# Patient Record
Sex: Female | Born: 1985 | Race: White | Hispanic: No | Marital: Single | Smoking: Current some day smoker
Health system: Southern US, Community
[De-identification: ages and names within clinical notes are randomized; demographics above are authoritative.]

## PROBLEM LIST (undated history)

## (undated) DIAGNOSIS — D259 Leiomyoma of uterus, unspecified: Secondary | ICD-10-CM

## (undated) DIAGNOSIS — R0789 Other chest pain: Secondary | ICD-10-CM

## (undated) DIAGNOSIS — N63 Unspecified lump in unspecified breast: Secondary | ICD-10-CM

## (undated) DIAGNOSIS — F419 Anxiety disorder, unspecified: Secondary | ICD-10-CM

## (undated) DIAGNOSIS — A749 Chlamydial infection, unspecified: Secondary | ICD-10-CM

## (undated) DIAGNOSIS — E119 Type 2 diabetes mellitus without complications: Secondary | ICD-10-CM

## (undated) DIAGNOSIS — F410 Panic disorder [episodic paroxysmal anxiety] without agoraphobia: Secondary | ICD-10-CM

## (undated) DIAGNOSIS — E7800 Pure hypercholesterolemia, unspecified: Secondary | ICD-10-CM

## (undated) HISTORY — DX: Unspecified lump in unspecified breast: N63.0

## (undated) HISTORY — DX: Panic disorder (episodic paroxysmal anxiety): F41.0

## (undated) HISTORY — PX: OTHER SURGICAL HISTORY: SHX169

## (undated) HISTORY — DX: Other chest pain: R07.89

## (undated) HISTORY — DX: Anxiety disorder, unspecified: F41.9

## (undated) HISTORY — DX: Chlamydial infection, unspecified: A74.9

---

## 2005-05-11 DIAGNOSIS — A749 Chlamydial infection, unspecified: Secondary | ICD-10-CM

## 2005-05-11 HISTORY — DX: Chlamydial infection, unspecified: A74.9

## 2010-03-09 ENCOUNTER — Emergency Department (HOSPITAL_COMMUNITY): Admission: EM | Admit: 2010-03-09 | Discharge: 2010-03-09 | Payer: Self-pay | Admitting: Emergency Medicine

## 2010-03-11 ENCOUNTER — Emergency Department (HOSPITAL_COMMUNITY): Admission: EM | Admit: 2010-03-11 | Discharge: 2010-03-11 | Payer: Self-pay | Admitting: Emergency Medicine

## 2010-03-12 ENCOUNTER — Emergency Department (HOSPITAL_COMMUNITY): Admission: EM | Admit: 2010-03-12 | Discharge: 2010-03-12 | Payer: Self-pay | Admitting: Family Medicine

## 2010-06-02 ENCOUNTER — Emergency Department (HOSPITAL_COMMUNITY)
Admission: EM | Admit: 2010-06-02 | Discharge: 2010-06-02 | Payer: Self-pay | Source: Home / Self Care | Admitting: Family Medicine

## 2010-06-03 LAB — POCT URINALYSIS DIPSTICK
Bilirubin Urine: NEGATIVE
Ketones, ur: NEGATIVE mg/dL
Nitrite: NEGATIVE
Protein, ur: NEGATIVE mg/dL
Specific Gravity, Urine: 1.02 (ref 1.005–1.030)
Urine Glucose, Fasting: NEGATIVE mg/dL
Urobilinogen, UA: 0.2 mg/dL (ref 0.0–1.0)
pH: 7 (ref 5.0–8.0)

## 2010-06-03 LAB — POCT PREGNANCY, URINE: Preg Test, Ur: NEGATIVE

## 2012-06-15 ENCOUNTER — Encounter (HOSPITAL_COMMUNITY): Payer: Self-pay | Admitting: *Deleted

## 2012-06-21 ENCOUNTER — Other Ambulatory Visit: Payer: Self-pay | Admitting: Obstetrics and Gynecology

## 2012-06-21 ENCOUNTER — Encounter (HOSPITAL_COMMUNITY): Payer: Self-pay

## 2012-06-21 ENCOUNTER — Ambulatory Visit (HOSPITAL_COMMUNITY)
Admission: RE | Admit: 2012-06-21 | Discharge: 2012-06-21 | Disposition: A | Discharge status: Home / Self Care | Payer: Self-pay | Source: Ambulatory Visit | Attending: Obstetrics and Gynecology | Admitting: Obstetrics and Gynecology

## 2012-06-21 VITALS — BP 110/60 | Temp 98.1°F | Ht 62.0 in | Wt 153.2 lb

## 2012-06-21 DIAGNOSIS — N644 Mastodynia: Secondary | ICD-10-CM

## 2012-06-21 DIAGNOSIS — N6320 Unspecified lump in the left breast, unspecified quadrant: Secondary | ICD-10-CM | POA: Insufficient documentation

## 2012-06-21 DIAGNOSIS — N63 Unspecified lump in unspecified breast: Secondary | ICD-10-CM

## 2012-06-21 DIAGNOSIS — Z1239 Encounter for other screening for malignant neoplasm of breast: Secondary | ICD-10-CM

## 2012-06-21 HISTORY — DX: Mastodynia: N64.4

## 2012-06-21 HISTORY — DX: Unspecified lump in the left breast, unspecified quadrant: N63.20

## 2012-06-21 NOTE — Progress Notes (Signed)
Patient complained of lumps and discomfort in both breasts. Patient rated pain at a 2 out of 10. Patient complained that nipple is pointing more to the left.  Pap Smear:    Pap smear not completed today. Last Pap smear was 02/11/2012 at the [facility] and normal. Per patient no history of abnormal Pap smears. No Pap smear results in EPIC.  Physical exam: Breasts Breasts symmetrical. No skin abnormalities bilateral breasts. No nipple retraction bilateral breasts. No nipple discharge bilateral breasts. No lymphadenopathy. No lumps palpated right breast. Palpated two lumps in the left breast at 3 o'clock 4 cm from the areola and 12 o'clock 1.5 cm from the areola. Complaints of tenderness when palpated right center breast. Patient referred to the [facility] for bilateral breast ultrasound. Appointment scheduled for Monday, June 27, 2012 at 1245.     Pelvic/Bimanual No Pap smear completed today since last Pap smear was 02/11/2012. Pap smear not indicated per BCCCP guidelines.

## 2012-06-21 NOTE — Patient Instructions (Signed)
Taught patient how to perform BSE. Patient did not need a Pap smear today due to last Pap smear was in 02/11/2012. Let her know BCCCP will cover Pap smears every 3 years unless has a history of abnormal Pap smears. Patient referred to the [facility] for bilateral breast ultrasound. Appointment scheduled for Monday, June 27, 2012 at 1245. Patient aware of appointment and will be there. Patient verbalized understanding.

## 2012-06-27 ENCOUNTER — Ambulatory Visit
Admission: RE | Admit: 2012-06-27 | Discharge: 2012-06-27 | Disposition: A | Discharge status: Home / Self Care | Payer: No Typology Code available for payment source | Source: Ambulatory Visit | Attending: Obstetrics and Gynecology | Admitting: Obstetrics and Gynecology

## 2012-06-27 DIAGNOSIS — N63 Unspecified lump in unspecified breast: Secondary | ICD-10-CM

## 2012-06-27 DIAGNOSIS — N644 Mastodynia: Secondary | ICD-10-CM

## 2012-09-14 ENCOUNTER — Ambulatory Visit (INDEPENDENT_AMBULATORY_CARE_PROVIDER_SITE_OTHER): Payer: BC Managed Care – PPO | Admitting: Physician Assistant

## 2012-09-14 VITALS — BP 102/66 | HR 96 | Temp 99.1°F | Resp 16 | Ht 62.5 in | Wt 156.4 lb

## 2012-09-14 DIAGNOSIS — N898 Other specified noninflammatory disorders of vagina: Secondary | ICD-10-CM

## 2012-09-14 DIAGNOSIS — B9689 Other specified bacterial agents as the cause of diseases classified elsewhere: Secondary | ICD-10-CM

## 2012-09-14 DIAGNOSIS — Z113 Encounter for screening for infections with a predominantly sexual mode of transmission: Secondary | ICD-10-CM

## 2012-09-14 DIAGNOSIS — N760 Acute vaginitis: Secondary | ICD-10-CM

## 2012-09-14 DIAGNOSIS — Z309 Encounter for contraceptive management, unspecified: Secondary | ICD-10-CM

## 2012-09-14 DIAGNOSIS — IMO0001 Reserved for inherently not codable concepts without codable children: Secondary | ICD-10-CM

## 2012-09-14 LAB — POCT WET PREP WITH KOH
Clue Cells Wet Prep HPF POC: 100
KOH Prep POC: NEGATIVE
RBC Wet Prep HPF POC: NEGATIVE
Trichomonas, UA: NEGATIVE
WBC Wet Prep HPF POC: NEGATIVE
Yeast Wet Prep HPF POC: NEGATIVE

## 2012-09-14 LAB — POCT URINE PREGNANCY: Preg Test, Ur: NEGATIVE

## 2012-09-14 MED ORDER — MEDROXYPROGESTERONE ACETATE 150 MG/ML IM SUSP
150.0000 mg | INTRAMUSCULAR | Status: AC
  Administered 2012-09-14: 150 mg via INTRAMUSCULAR

## 2012-09-14 MED ORDER — METRONIDAZOLE 500 MG PO TABS: 500.0000 mg | ORAL_TABLET | Freq: Two times a day (BID) | ORAL | Status: DC

## 2012-09-14 NOTE — Progress Notes (Signed)
Subjective:    Patient ID: Jill Burke, female    DOB: 1985/12/11, 27 y.o.   MRN: [id number]  HPI  This 27 y.o. female presents for evaluation of vaginal discharge x 4-6 weeks, with a foul odor x 2-3 days. She is also 2 days overdue for DepoProvera for contraception.  She was scheduled at the [facility], but they weren't able to see her for both today.  She is one sexual partner, but recently received a Facebook message from a woman who claims to have been having unprotected sex x 7 months with the patient's longtime partner.   Past Medical History  Diagnosis Date  . Chlamydia 2007    treated    History reviewed. No pertinent past surgical history.  Prior to Admission medications   Medication Sig Start Date End Date Taking? Authorizing Provider  medroxyPROGESTERone (DEPO-PROVERA) 150 MG/ML injection Inject 150 mg into the muscle every 3 (three) months.   Yes Historical Provider, MD  metroNIDAZOLE (FLAGYL) 500 MG tablet Take 1 tablet (500 mg total) by mouth 2 (two) times daily with a meal. DO NOT CONSUME ALCOHOL WHILE TAKING THIS MEDICATION. 09/14/12   Fernande Bras, PA-C    No Known Allergies  History   Social History  . Marital Status: Single    Spouse Name: N/A    Number of Children: 3  . Years of Education: 12   Occupational History  . collections agent    Social History Main Topics  . Smoking status: Current Every Day Smoker -- 0.50 packs/day for 3 years  . Smokeless tobacco: Never Used  . Alcohol Use: No  . Drug Use: No  . Sexually Active: Yes    Birth Control/ Protection: Injection    Family History  Problem Relation Age of Onset  . Hypertension Father   . Heart disease Father   . Diabetes Paternal Grandmother      Review of Systems As above.  Denies chest pain, shortness of breath, HA, dizziness, vision change, nausea, vomiting, diarrhea, constipation, melena, hematochezia, dysuria, increased urinary urgency or frequency, increased  hunger or thirst, unintentional weight change, unexplained myalgias or arthralgias, rash.     Objective:   Physical Exam  Vitals reviewed. Constitutional: She is oriented to person, place, and time. She appears well-developed and well-nourished.  Eyes: Conjunctivae are normal. No scleral icterus.  Cardiovascular: Normal rate.   Pulmonary/Chest: Effort normal.  Abdominal: Hernia confirmed negative in the right inguinal area and confirmed negative in the left inguinal area.  Genitourinary: Uterus normal. Pelvic exam was performed with patient supine. No labial fusion. There is no rash, tenderness, lesion or injury on the right labia. There is no rash, tenderness, lesion or injury on the left labia. Cervix exhibits no motion tenderness, no discharge and no friability. Right adnexum displays no mass, no tenderness and no fullness. Left adnexum displays no mass, no tenderness and no fullness. No erythema, tenderness or bleeding around the vagina. No foreign body around the vagina. No signs of injury around the vagina. Vaginal discharge found.  Lymphadenopathy:       Right: No inguinal adenopathy present.       Left: No inguinal adenopathy present.  Neurological: She is alert and oriented to person, place, and time.  Skin: Skin is warm and dry.  Psychiatric: She has a normal mood and affect. Her behavior is normal.      Results for orders placed in visit on 09/14/12  POCT URINE PREGNANCY  Result Value Range   Preg Test, Ur Negative    POCT WET PREP WITH KOH      Result Value Range   Trichomonas, UA Negative     Clue Cells Wet Prep HPF POC 100%     Epithelial Wet Prep HPF POC 10-20     Yeast Wet Prep HPF POC neg     Bacteria Wet Prep HPF POC 4++     RBC Wet Prep HPF POC neg     WBC Wet Prep HPF POC neg     KOH Prep POC Negative         Assessment & Plan:  Contraception - Plan: POCT urine pregnancy, medroxyPROGESTERone (DEPO-PROVERA) injection 150 mg  Routine screening for STI  (sexually transmitted infection) - Plan: Hepatitis B surface antibody, Hepatitis B surface antigen, Hepatitis C antibody, HIV antibody, HSV(herpes simplex vrs) 1+2 ab-IgG, RPR, GC/Chlamydia Probe Amp  Vaginal discharge - Plan: GC/Chlamydia Probe Amp, POCT Wet Prep with KOH  BV (bacterial vaginosis) - Plan: metroNIDAZOLE (FLAGYL) 500 MG tablet   Patient Instructions  Return for the next DepoProvera injection 7/23-8/06. Schedule a complete physical at your convenience (this can be done at the same time as your Depo Provera injection).  I will contact you with your lab results as soon as they are available.   If you have not heard from me in 2 weeks, please contact me.  The fastest way to get your results is to register for My Chart (see the instructions on the last page of this printout).     Fernande Bras, PA-C Physician Assistant-Certified [facility]

## 2012-09-14 NOTE — Patient Instructions (Addendum)
Return for the next DepoProvera injection 7/23-8/06. Schedule a complete physical at your convenience (this can be done at the same time as your Depo Provera injection).  I will contact you with your lab results as soon as they are available.   If you have not heard from me in 2 weeks, please contact me.  The fastest way to get your results is to register for My Chart (see the instructions on the last page of this printout).

## 2012-09-15 LAB — HEPATITIS B SURFACE ANTIGEN: Hepatitis B Surface Ag: NEGATIVE

## 2012-09-15 LAB — HIV ANTIBODY (ROUTINE TESTING W REFLEX): HIV: NONREACTIVE

## 2012-09-15 LAB — HEPATITIS C ANTIBODY: HCV Ab: NEGATIVE

## 2012-09-15 LAB — RPR

## 2012-09-16 LAB — HEPATITIS B SURFACE ANTIBODY, QUANTITATIVE: Hepatitis B-Post: >1000 m[IU]/mL

## 2012-09-16 LAB — GC/CHLAMYDIA PROBE AMP
CT Probe RNA: NEGATIVE
GC Probe RNA: NEGATIVE

## 2012-09-16 LAB — HSV(HERPES SIMPLEX VRS) I + II AB-IGG
HSV 1 Glycoprotein G Ab, IgG: 10.85 IV — ABNORMAL HIGH
HSV 2 Glycoprotein G Ab, IgG: 3.29 IV — ABNORMAL HIGH

## 2012-09-19 ENCOUNTER — Encounter: Payer: Self-pay | Admitting: Family Medicine

## 2012-11-07 ENCOUNTER — Telehealth: Payer: Self-pay

## 2012-11-07 NOTE — Telephone Encounter (Signed)
Pt is needing to talk with someone about the diagnosis from may 7th visit she believes the symptoms are coming back  Best [phone]

## 2012-11-07 NOTE — Telephone Encounter (Signed)
Spoke to patient, and she is advised of labs, states she was not advised of the herpes results. She was tearful and disconnected call. She complains of recurrent vaginal d/c advised her the herpes would not cause a discharge, she should come back in for recheck, to you FYI Did advise her these results were sent via my chart.

## 2012-11-07 NOTE — Telephone Encounter (Signed)
  All these tests are normal EXCEPT: The herpes tests are positive for exposure to both the type that causes fever blisters and the type that causes genital blisters.   The test doesn't tell us when you were exposed, who exposed you, whether you ever have had an outbreak, or whether you ever will.  Your partner needs to be tested as well. If he is positive, and you've never had an outbreak that you can recall, there is nothing you need to do now. If he is negative, we'll advise you to take a medication to help prevent him from getting it.  You'll need to discuss this with any future sexual partners that you have, so they can be tested, and you can take medication to prevent transmission if they are negative.  BV (bacterial vaginosis) - Plan: metroNIDAZOLE (FLAGYL) 500 MG tablet  Called her what are her symptoms? Left message for her to call me back.

## 2012-11-08 ENCOUNTER — Telehealth: Payer: Self-pay

## 2012-11-08 NOTE — Telephone Encounter (Signed)
Patient calling back to get lab results  Best number: [phone]

## 2012-11-08 NOTE — Telephone Encounter (Signed)
Patient has been notified

## 2012-11-09 ENCOUNTER — Telehealth: Payer: Self-pay | Admitting: Radiology

## 2012-11-09 NOTE — Telephone Encounter (Signed)
Patient states she was told she will not be tested for Herpes virus/ patient states she was told she did not need testing, and also states she was not called with these results. I gave her the results by phone when I spoke to her earlier in the week. She states she got a phone call about labs previously and was not told she was positive for herpes (when I spoke to her on 11/07/12, she indicated no one called her, now she indicates she was called.) patient states she went to the health department and was told she does not have herpes because her lymph nodes are not swollen. Patient asking why she was tested when she was told she would not be tested and asking why no one gave the results. I told her previously these results were given in mychart. She states now (different from previous phone call with her) she did get a phone call, but was not given results. I told her I am not sure but I will send message to you and see if you can help.  [phone]

## 2012-11-09 NOTE — Telephone Encounter (Signed)
Patient called again about the labs, but call was disconnected, she could not hear me. If she calls back I will inform her again of her lab results.

## 2012-11-10 ENCOUNTER — Telehealth: Payer: Self-pay

## 2012-11-10 NOTE — Telephone Encounter (Signed)
Yes, she wants to speak to you, will you call her?

## 2012-11-10 NOTE — Telephone Encounter (Signed)
I am very sorry that she did not think she was getting the HSV testing and that it was done.   I reviewed my note, and did not document anything that indicated that she did not want that test.   If she did not want the test performed, and I ordered it anyway (unintentionally), we should Dr. Annette Stable the HSV testing. I released the results to her via My Chart, as I told her I would.  I am happy to explain the result to her in detail.  Perhaps she should come in so I can answer all her questions face-to-face (without an OV, unless she has other things she needs to be seen for)?  Or, I'm happy to call her-what time(s) would I be able to reach her?

## 2012-11-10 NOTE — Telephone Encounter (Signed)
I spoke with the patient by phone and explained the results.  I apologized for the miscommunication about the testing for HSV.

## 2012-11-10 NOTE — Telephone Encounter (Signed)
Patient has questions needs call back from doctor.  [phone]

## 2012-11-14 NOTE — Telephone Encounter (Signed)
Patient has spoken to [facility]

## 2012-11-16 ENCOUNTER — Telehealth: Payer: Self-pay

## 2012-11-16 NOTE — Telephone Encounter (Signed)
Jill Burke,  PATIENT WOULD LIKE TO SPEAK WITH YOU REGARDING HER LAST OV.  [phone]

## 2012-11-17 NOTE — Telephone Encounter (Addendum)
4 attempts to reach patient today.  Will try again tomorrow.

## 2012-11-17 NOTE — Telephone Encounter (Signed)
It looks like you have had lengthy conversation already. Would you like me to have her come back in?

## 2012-11-18 ENCOUNTER — Telehealth: Payer: Self-pay | Admitting: Physician Assistant

## 2012-11-18 MED ORDER — VALACYCLOVIR HCL 500 MG PO TABS: 500.0000 mg | ORAL_TABLET | Freq: Every day | ORAL | Status: DC

## 2012-11-18 NOTE — Telephone Encounter (Signed)
Discussed HSV, treatment and transmission risks at length.  Meds ordered this encounter  Medications  . valACYclovir (VALTREX) 500 MG tablet    Sig: Take 1 tablet (500 mg total) by mouth daily. Take BID x 3 days as needed for outbreak.    Dispense:  33 tablet    Refill:  12    OK to dispense #100, RF x 4 if patient prefers    Order Specific Question:  Supervising Provider    Answer:  DOOLITTLE, ROBERT P [[id number]]

## 2012-11-23 ENCOUNTER — Telehealth: Payer: Self-pay

## 2012-11-23 DIAGNOSIS — Z113 Encounter for screening for infections with a predominantly sexual mode of transmission: Secondary | ICD-10-CM

## 2012-11-23 NOTE — Telephone Encounter (Signed)
PATIENT WOULD LIKE TO GET A RETURN CALL FROM CHELLE J. SHE DID NOT WANT TO SAY WHAT IT WAS REGARDING. BEST PHONE [phone]   MBC

## 2012-11-24 NOTE — Telephone Encounter (Signed)
For Chelle on Monday.

## 2012-11-29 NOTE — Telephone Encounter (Signed)
11/29/2012 - AMY - CAN THIS ENCOUNTER BE CLOSED?  THANKS   MBC

## 2012-11-30 NOTE — Telephone Encounter (Signed)
"  Really confused." "I think I need to come back in and get re-tested." Her current partner and previous partner both tested negative for both HSV type 1 and type 2. She will come in tomorrow for lab only.  Orders placed.

## 2012-11-30 NOTE — Telephone Encounter (Signed)
No, Chelle left message for pt.

## 2013-01-17 ENCOUNTER — Ambulatory Visit (INDEPENDENT_AMBULATORY_CARE_PROVIDER_SITE_OTHER): Payer: BC Managed Care – PPO | Admitting: Family Medicine

## 2013-01-17 VITALS — BP 102/66 | HR 66 | Temp 98.5°F | Resp 18 | Ht 63.0 in | Wt 157.0 lb

## 2013-01-17 DIAGNOSIS — J029 Acute pharyngitis, unspecified: Secondary | ICD-10-CM

## 2013-01-17 LAB — POCT RAPID STREP A (OFFICE): Rapid Strep A Screen: NEGATIVE

## 2013-01-17 MED ORDER — FIRST-DUKES MOUTHWASH MT SUSP: 5.0000 mL | Freq: Four times a day (QID) | OROMUCOSAL | Status: DC | PRN

## 2013-01-17 NOTE — Progress Notes (Signed)
[facility] [address] [place] [address] [phone]  Date:  01/17/2013   Name:  Jill Burke   DOB:  28-Jan-1986   MRN:  [id number]  PCP:  Nilda Simmer, MD    Chief Complaint: Sore Throat   History of Present Illness:  Jill Burke is a 27 y.o. very pleasant female patient who presents with the following:  She has noted a ST for about 2 days- exposed at her work- place.   She has not noted a cough, but has felt "feverish."   She has not noted any body aches or chills No GI symptoms.   Patient Active Problem List   Diagnosis Date Noted  . Left breast lump 06/21/2012  . Breast pain, right 06/21/2012    Past Medical History  Diagnosis Date  . Chlamydia 2007    treated    History reviewed. No pertinent past surgical history.  History  Substance Use Topics  . Smoking status: Current Every Day Smoker -- 0.50 packs/day for 3 years  . Smokeless tobacco: Never Used  . Alcohol Use: No    Family History  Problem Relation Age of Onset  . Hypertension Father   . Heart disease Father   . Diabetes Paternal Grandmother     Allergies  Allergen Reactions  . Codeine     Medication list has been reviewed and updated.  Current Outpatient Prescriptions on File Prior to Visit  Medication Sig Dispense Refill  . medroxyPROGESTERone (DEPO-PROVERA) 150 MG/ML injection Inject 150 mg into the muscle every 3 (three) months.      . metroNIDAZOLE (FLAGYL) 500 MG tablet Take 1 tablet (500 mg total) by mouth 2 (two) times daily with a meal. DO NOT CONSUME ALCOHOL WHILE TAKING THIS MEDICATION.  14 tablet  0  . valACYclovir (VALTREX) 500 MG tablet Take 1 tablet (500 mg total) by mouth daily. Take BID x 3 days as needed for outbreak.  33 tablet  12   Current Facility-Administered Medications on File Prior to Visit  Medication Dose Route Frequency Provider Last Rate Last Dose  . medroxyPROGESTERone (DEPO-PROVERA) injection 150 mg  150 mg Intramuscular Q90 days  Fernande Bras, PA-C   150 mg at 09/14/12 2124    Review of Systems:  As per HPI- otherwise negative.   Physical Examination: Filed Vitals:   01/17/13 1227  BP: 102/66  Pulse: 66  Temp: 98.5 F (36.9 C)  Resp: 18   Filed Vitals:   01/17/13 1227  Height: 5\' 3"  (1.6 m)  Weight: 157 lb (71.215 kg)   Body mass index is 27.82 kg/(m^2). Ideal Body Weight: Weight in (lb) to have BMI = 25: 140.8  GEN: WDWN, NAD, Non-toxic, A & O x 3, looks well HEENT: Atraumatic, Normocephalic. Neck supple. No masses, No LAD.  Bilateral TM wnl, oropharynx normal.  PEERL,EOMI.   Ears and Nose: No external deformity. CV: RRR, No M/G/R. No JVD. No thrill. No extra heart sounds. PULM: CTA B, no wheezes, crackles, rhonchi. No retractions. No resp. distress. No accessory muscle use. ABD: S, NT, ND,. No rebound. No HSM. EXTR: No c/c/e NEURO Normal gait.  PSYCH: Normally interactive. Conversant. Not depressed or anxious appearing.  Calm demeanor.   Results for orders placed in visit on 01/17/13  POCT RAPID STREP A (OFFICE)      Result Value Range   Rapid Strep A Screen Negative  Negative    Assessment and Plan: Acute pharyngitis - Plan: POCT rapid strep A,  Diphenhyd-Hydrocort-Nystatin (FIRST-DUKES MOUTHWASH) SUSP  Throat culture pending.  Suspect viral pharyngitis.  DMM prn for pain.  Let me know if not better, Sooner if worse.     Signed Abbe Amsterdam, MD

## 2013-01-17 NOTE — Patient Instructions (Addendum)
I will be in touch with your culture result. Ibuprofen, tylenol and throat wash as needed

## 2013-01-19 ENCOUNTER — Encounter: Payer: Self-pay | Admitting: Family Medicine

## 2013-01-19 LAB — CULTURE, GROUP A STREP: Organism ID, Bacteria: NORMAL

## 2013-01-24 ENCOUNTER — Telehealth: Payer: Self-pay

## 2013-01-24 DIAGNOSIS — J209 Acute bronchitis, unspecified: Secondary | ICD-10-CM

## 2013-01-24 NOTE — Telephone Encounter (Signed)
Spoke with pt. She is not feeling any better and now has cough/congestion.

## 2013-01-24 NOTE — Telephone Encounter (Signed)
Patient would like to know lab results  Best # until 5:30pm [phone] after 5:30pm [phone]

## 2013-01-25 MED ORDER — AZITHROMYCIN 250 MG PO TABS: ORAL_TABLET | ORAL | Status: DC

## 2013-01-25 NOTE — Telephone Encounter (Signed)
Called her back.  She was seen about one week ago with likely viral pharyngitis, negative throat culture.    She notes she has congestion, taking mucinex.  She has head congestion.  No more ST.  She does have a cough- just started yesterday.   The mucinex is helping her to cough up some mucus.   She does not have any fever, but does feel tired.  She does not have any body aches. "i feel exhausted."  Will rx a zpack for bronchitis/ sinusitis. If not better in a few days she will come back to clinic or call.

## 2013-03-16 ENCOUNTER — Other Ambulatory Visit: Payer: Self-pay

## 2013-05-22 ENCOUNTER — Emergency Department (HOSPITAL_COMMUNITY)
Admission: EM | Admit: 2013-05-22 | Discharge: 2013-05-22 | Disposition: A | Discharge status: Home / Self Care | Payer: Self-pay | Attending: Emergency Medicine | Admitting: Emergency Medicine

## 2013-05-22 ENCOUNTER — Encounter (HOSPITAL_COMMUNITY): Payer: Self-pay | Admitting: Emergency Medicine

## 2013-05-22 DIAGNOSIS — B349 Viral infection, unspecified: Secondary | ICD-10-CM

## 2013-05-22 DIAGNOSIS — F17200 Nicotine dependence, unspecified, uncomplicated: Secondary | ICD-10-CM | POA: Insufficient documentation

## 2013-05-22 DIAGNOSIS — B9689 Other specified bacterial agents as the cause of diseases classified elsewhere: Secondary | ICD-10-CM | POA: Insufficient documentation

## 2013-05-22 DIAGNOSIS — IMO0001 Reserved for inherently not codable concepts without codable children: Secondary | ICD-10-CM | POA: Insufficient documentation

## 2013-05-22 DIAGNOSIS — R197 Diarrhea, unspecified: Secondary | ICD-10-CM | POA: Insufficient documentation

## 2013-05-22 DIAGNOSIS — A499 Bacterial infection, unspecified: Secondary | ICD-10-CM | POA: Insufficient documentation

## 2013-05-22 DIAGNOSIS — N760 Acute vaginitis: Secondary | ICD-10-CM | POA: Insufficient documentation

## 2013-05-22 DIAGNOSIS — B9789 Other viral agents as the cause of diseases classified elsewhere: Secondary | ICD-10-CM | POA: Insufficient documentation

## 2013-05-22 LAB — URINE MICROSCOPIC-ADD ON

## 2013-05-22 LAB — COMPREHENSIVE METABOLIC PANEL
ALT: 8 U/L (ref 0–35)
AST: 10 U/L (ref 0–37)
Albumin: 3.9 g/dL (ref 3.5–5.2)
Alkaline Phosphatase: 51 U/L (ref 39–117)
BUN: 13 mg/dL (ref 6–23)
CO2: 21 mEq/L (ref 19–32)
Calcium: 8.9 mg/dL (ref 8.4–10.5)
Chloride: 103 mEq/L (ref 96–112)
Creatinine, Ser: 0.77 mg/dL (ref 0.50–1.10)
GFR calc Af Amer: >90 mL/min (ref >90)
GFR calc non Af Amer: >90 mL/min (ref >90)
Glucose, Bld: 137 mg/dL — ABNORMAL HIGH (ref 70–99)
Potassium: 3.8 mEq/L (ref 3.7–5.3)
Sodium: 141 mEq/L (ref 137–147)
Total Bilirubin: <0.2 mg/dL — ABNORMAL LOW (ref 0.3–1.2)
Total Protein: 6.7 g/dL (ref 6.0–8.3)

## 2013-05-22 LAB — WET PREP, GENITAL
Trich, Wet Prep: NONE SEEN
Yeast Wet Prep HPF POC: NONE SEEN

## 2013-05-22 LAB — URINALYSIS, ROUTINE W REFLEX MICROSCOPIC
Bilirubin Urine: NEGATIVE
Glucose, UA: NEGATIVE mg/dL
Ketones, ur: NEGATIVE mg/dL
Leukocytes, UA: NEGATIVE
Nitrite: NEGATIVE
Protein, ur: NEGATIVE mg/dL
Specific Gravity, Urine: 1.023 (ref 1.005–1.030)
Urobilinogen, UA: 0.2 mg/dL (ref 0.0–1.0)
pH: 5 (ref 5.0–8.0)

## 2013-05-22 LAB — CBC WITH DIFFERENTIAL/PLATELET
Basophils Absolute: 0.1 10*3/uL (ref 0.0–0.1)
Basophils Relative: 1 % (ref 0–1)
Eosinophils Absolute: 0.2 10*3/uL (ref 0.0–0.7)
Eosinophils Relative: 2 % (ref 0–5)
HCT: 42.2 % (ref 36.0–46.0)
Hemoglobin: 14.6 g/dL (ref 12.0–15.0)
Lymphocytes Relative: 30 % (ref 12–46)
Lymphs Abs: 2.6 10*3/uL (ref 0.7–4.0)
MCH: 32.4 pg (ref 26.0–34.0)
MCHC: 34.6 g/dL (ref 30.0–36.0)
MCV: 93.8 fL (ref 78.0–100.0)
Monocytes Absolute: 0.4 10*3/uL (ref 0.1–1.0)
Monocytes Relative: 4 % (ref 3–12)
Neutro Abs: 5.5 10*3/uL (ref 1.7–7.7)
Neutrophils Relative %: 63 % (ref 43–77)
Platelets: 235 10*3/uL (ref 150–400)
RBC: 4.5 MIL/uL (ref 3.87–5.11)
RDW: 12.6 % (ref 11.5–15.5)
WBC: 8.8 10*3/uL (ref 4.0–10.5)

## 2013-05-22 LAB — LIPASE, BLOOD: Lipase: 31 U/L (ref 11–59)

## 2013-05-22 MED ORDER — CLOTRIMAZOLE 1 % EX CREA: TOPICAL_CREAM | CUTANEOUS | Status: DC

## 2013-05-22 MED ORDER — METRONIDAZOLE 0.75 % VA GEL: 1.0000 | Freq: Two times a day (BID) | VAGINAL | Status: DC

## 2013-05-22 MED ORDER — SODIUM CHLORIDE 0.9 % IV BOLUS (SEPSIS)
1000.0000 mL | Freq: Once | INTRAVENOUS | Status: AC
  Administered 2013-05-22: 1000 mL via INTRAVENOUS

## 2013-05-22 MED ORDER — BENZONATATE 100 MG PO CAPS: 200.0000 mg | ORAL_CAPSULE | Freq: Two times a day (BID) | ORAL | Status: DC | PRN

## 2013-05-22 MED ORDER — ONDANSETRON HCL 4 MG PO TABS: 4.0000 mg | ORAL_TABLET | Freq: Four times a day (QID) | ORAL | Status: DC

## 2013-05-22 MED ORDER — ONDANSETRON HCL 4 MG/2ML IJ SOLN
4.0000 mg | INTRAMUSCULAR | Status: AC
  Administered 2013-05-22: 4 mg via INTRAVENOUS
  Filled 2013-05-22: qty 2

## 2013-05-22 NOTE — ED Provider Notes (Signed)
CSN: [id number]     Arrival date & time 05/22/13  1657 History   First MD Initiated Contact with Patient 05/22/13 2005     Chief Complaint  Patient presents with  . Abdominal Pain  . Emesis  . Diarrhea   (Consider location/radiation/quality/duration/timing/severity/associated sxs/prior Treatment) HPI Comments: Patient is a 28 year old otherwise healthy female who presents today for abdominal pain x2 days. Patient states the pain has been present in her lower abdomen and has gradually improved spontaneously since onset yesterday. Symptoms have been associated with nonbloody, nonbilious emesis as well as nonbloody diarrhea x2 yesterday and x1 today. She states that symptoms were preceded one week ago by a fever x4 days, myalgias, nasal congestion, sore throat, and sinus pressure. Patient's been taking Tylenol and over-the-counter Nyquil cough/cold medicine with mild relief of associated symptoms. She denies any modifying factors of her abdominal pain. She endorses some mild vaginal discharge and denies CP, SOB, melena, hematochezia, dysuria, hematuria, vaginal pain, vaginal bleeding, numbness/tingling, and weakness. Patient denies a hx of abdominal surgeries.  Patient is a 28 y.o. female presenting with abdominal pain, vomiting, and diarrhea. The history is provided by the patient. No language interpreter was used.  Abdominal Pain Associated symptoms: cough, diarrhea, fever (resolved), nausea, vaginal discharge and vomiting   Associated symptoms: no chest pain, no dysuria, no hematuria, no shortness of breath and no vaginal bleeding   Emesis Associated symptoms: abdominal pain, diarrhea and myalgias   Diarrhea Associated symptoms: abdominal pain, fever (resolved), myalgias and vomiting     Past Medical History  Diagnosis Date  . Chlamydia 2007    treated   History reviewed. No pertinent past surgical history. Family History  Problem Relation Age of Onset  . Hypertension Father   . Heart  disease Father   . Diabetes Paternal Grandmother    History  Substance Use Topics  . Smoking status: Current Every Day Smoker -- 0.50 packs/day for 3 years  . Smokeless tobacco: Never Used  . Alcohol Use: No   OB History   Grav Para Term Preterm Abortions TAB SAB Ect Mult Living   3 3 3       3      Review of Systems  Constitutional: Positive for fever (resolved).  HENT: Positive for congestion and sinus pressure. Negative for drooling.   Respiratory: Positive for cough. Negative for shortness of breath.   Cardiovascular: Negative for chest pain.  Gastrointestinal: Positive for nausea, vomiting, abdominal pain and diarrhea. Negative for blood in stool and rectal pain.  Genitourinary: Positive for vaginal discharge. Negative for dysuria, urgency, hematuria, flank pain, vaginal bleeding and vaginal pain.  Musculoskeletal: Positive for myalgias.  Skin: Negative for rash.  Neurological: Negative for weakness and numbness.  All other systems reviewed and are negative.    Allergies  Codeine  Home Medications   Current Outpatient Rx  Name  Route  Sig  Dispense  Refill  . acetaminophen (TYLENOL) 500 MG tablet   Oral   Take 500 mg by mouth every 6 (six) hours as needed for fever.         . bismuth subsalicylate (PEPTO BISMOL) 262 MG/15ML suspension   Oral   Take 30 mLs by mouth every 6 (six) hours as needed for indigestion.         . Pseudoeph-Doxylamine-DM-APAP (NYQUIL D COLD/FLU PO)   Oral   Take 1 tablet by mouth at bedtime as needed (cold symptoms).         . Pseudoephedrine-APAP-DM (DAYQUIL PO)  Oral   Take 1 capsule by mouth daily as needed (cold symptoms).         . clotrimazole (LOTRIMIN) 1 % cream      Apply to affected area 2 times daily   15 g   0   . medroxyPROGESTERone (DEPO-PROVERA) 150 MG/ML injection   Intramuscular   Inject 150 mg into the muscle every 3 (three) months.         . metroNIDAZOLE (METROGEL VAGINAL) 0.75 % vaginal gel    Vaginal   Place 1 Applicatorful vaginally 2 (two) times daily.   70 g   0   . ondansetron (ZOFRAN) 4 MG tablet   Oral   Take 1 tablet (4 mg total) by mouth every 6 (six) hours.   12 tablet   0    BP 111/75  Pulse 96  Temp(Src) 98.1 F (36.7 C) (Oral)  Resp 18  Ht 5' 2.5" (1.588 m)  Wt 152 lb (68.947 kg)  BMI 27.34 kg/m2  SpO2 95%  Physical Exam  Nursing note and vitals reviewed. Constitutional: She is oriented to person, place, and time. She appears well-developed and well-nourished. No distress.  HENT:  Head: Normocephalic and atraumatic.  Mouth/Throat: Oropharynx is clear and moist. No oropharyngeal exudate.  Airway patent and patient tolerating secretions without difficulty. Uvula midline.  Eyes: Conjunctivae and EOM are normal. Pupils are equal, round, and reactive to light. No scleral icterus.  Neck: Normal range of motion. Neck supple.  Cardiovascular: Normal rate, regular rhythm and normal heart sounds.   Pulmonary/Chest: Effort normal and breath sounds normal. No respiratory distress. She has no wheezes. She has no rales.  No tachypnea, retractions, or accessory muscle use.  Abdominal: Soft. She exhibits no distension. There is no tenderness. There is no rebound and no guarding.  No peritoneal signs or evidence of acute surgical abdomen  Genitourinary: Vagina normal. There is no rash, tenderness, lesion or injury on the right labia. There is no rash, tenderness, lesion or injury on the left labia. Uterus is not tender. Cervix exhibits no motion tenderness, no discharge and no friability. Right adnexum displays no mass, no tenderness and no fullness. Left adnexum displays no mass, no tenderness and no fullness.  Musculoskeletal: Normal range of motion.  Neurological: She is alert and oriented to person, place, and time.  Skin: Skin is warm and dry. No rash noted. She is not diaphoretic. No erythema. No pallor.  Psychiatric: She has a normal mood and affect. Her behavior  is normal.    ED Course  Procedures (including critical care time) Labs Review Labs Reviewed  WET PREP, GENITAL - Abnormal; Notable for the following:    Clue Cells Wet Prep HPF POC MANY (*)    WBC, Wet Prep HPF POC MANY (*)    All other components within normal limits  COMPREHENSIVE METABOLIC PANEL - Abnormal; Notable for the following:    Glucose, Bld 137 (*)    Total Bilirubin <0.2 (*)    All other components within normal limits  URINALYSIS, ROUTINE W REFLEX MICROSCOPIC - Abnormal; Notable for the following:    Hgb urine dipstick TRACE (*)    All other components within normal limits  URINE MICROSCOPIC-ADD ON - Abnormal; Notable for the following:    Bacteria, UA MANY (*)    All other components within normal limits  GC/CHLAMYDIA PROBE AMP  URINE CULTURE  CBC WITH DIFFERENTIAL  LIPASE, BLOOD   Imaging Review No results found.  EKG Interpretation  None       MDM   1. Viral illness   2. Bacterial vaginosis    Uncomplicated viral illness. Patient well and nontoxic appearing, hemodynamically stable, and afebrile. Physical exam unremarkable and serial abdominal examinations without tenderness to palpation peritoneal signs. Patient has been able to tolerate food and fluids by mouth during ED stay without emesis. Patient has no leukocytosis electrolyte imbalance. Liver and kidney function preserved. Doubt acute emergent intra-abdominal process. Urinalysis does not suggest infection at this time; urine culture pending as sample significant for many bacteria. Wet prep findings consistent with bacterial vaginosis. Patient stable for discharge with prescriptions for Zofran for nausea control as well as MetroGel for bacterial vaginosis. Return precautions discussed and patient agreeable to plan with no unaddressed concerns.   Filed Vitals:   05/22/13 2115 05/22/13 2130 05/22/13 2200 05/22/13 2205  BP: 116/70 111/69 111/75   Pulse: 101 86 96   Temp:    98.1 F (36.7 C)   TempSrc:    Oral  Resp:      Height:      Weight:      SpO2: 100% 98% 95%        Antony Madura, PA-C 05/22/13 2220

## 2013-05-22 NOTE — Discharge Instructions (Signed)
Recommend you use Metrogel for bacterial vaginosis, Zofran for nausea, and lotrimin cream for your ringworm in addition to [employer] Blue Shampoo. Recommend ibuprofen as needed for pain control. Follow up with your primary doctor. Return if symptoms worsen.  Influenza, Adult Influenza ("the flu") is a viral infection of the respiratory tract. It occurs more often in winter months because people spend more time in close contact with one another. Influenza can make you feel very sick. Influenza easily spreads from person to person (contagious). CAUSES  Influenza is caused by a virus that infects the respiratory tract. You can catch the virus by breathing in droplets from an infected person's cough or sneeze. You can also catch the virus by touching something that was recently contaminated with the virus and then touching your mouth, nose, or eyes. SYMPTOMS  Symptoms typically last 4 to 10 days and may include:  Fever.  Chills.  Headache, body aches, and muscle aches.  Sore throat.  Chest discomfort and cough.  Poor appetite.  Weakness or feeling tired.  Dizziness.  Nausea or vomiting. DIAGNOSIS  Diagnosis of influenza is often made based on your history and a physical exam. A nose or throat swab test can be done to confirm the diagnosis. RISKS AND COMPLICATIONS You may be at risk for a more severe case of influenza if you smoke cigarettes, have diabetes, have chronic heart disease (such as heart failure) or lung disease (such as asthma), or if you have a weakened immune system. Elderly people and pregnant women are also at risk for more serious infections. The most common complication of influenza is a lung infection (pneumonia). Sometimes, this complication can require emergency medical care and may be life-threatening. PREVENTION  An annual influenza vaccination (flu shot) is the best way to avoid getting influenza. An annual flu shot is now routinely recommended for all adults in the  [place] TREATMENT  In mild cases, influenza goes away on its own. Treatment is directed at relieving symptoms. For more severe cases, your caregiver may prescribe antiviral medicines to shorten the sickness. Antibiotic medicines are not effective, because the infection is caused by a virus, not by bacteria. HOME CARE INSTRUCTIONS  Only take over-the-counter or prescription medicines for pain, discomfort, or fever as directed by your caregiver.  Use a cool mist humidifier to make breathing easier.  Get plenty of rest until your temperature returns to normal. This usually takes 3 to 4 days.  Drink enough fluids to keep your urine clear or pale yellow.  Cover your mouth and nose when coughing or sneezing, and wash your hands well to avoid spreading the virus.  Stay home from work or school until your fever has been gone for at least 1 full day. SEEK MEDICAL CARE IF:   You have chest pain or a deep cough that worsens or produces more mucus.  You have nausea, vomiting, or diarrhea. SEEK IMMEDIATE MEDICAL CARE IF:   You have difficulty breathing, shortness of breath, or your skin or nails turn bluish.  You have severe neck pain or stiffness.  You have a severe headache, facial pain, or earache.  You have a worsening or recurring fever.  You have nausea or vomiting that cannot be controlled. MAKE SURE YOU:  Understand these instructions.  Will watch your condition.  Will get help right away if you are not doing well or get worse. Document Released: 04/24/2000 Document Revised: 10/27/2011 Document Reviewed: 07/27/2011 [facility] Patient Information 2014 [employer], [place].

## 2013-05-22 NOTE — ED Notes (Signed)
Discharge instructions reviewed with patient. Patient requesting cough medicine and work note.

## 2013-05-22 NOTE — ED Notes (Signed)
Pt is here with vomiting, diarrhea, and abdominal pain  And reports recent fever, body aches

## 2013-05-22 NOTE — ED Provider Notes (Signed)
Medical screening examination/treatment/procedure(s) were performed by non-physician practitioner and as supervising physician I was immediately available for consultation/collaboration.  EKG Interpretation   None         Ryder Man, MD 05/22/13 2230 

## 2013-05-23 ENCOUNTER — Telehealth (HOSPITAL_COMMUNITY): Payer: Self-pay

## 2013-05-23 NOTE — Progress Notes (Signed)
Spoke with Ebbie Ridgehris Lawyer PA .EPIC chart reviewed .This CM will call in Zofran order( patient reports she was not given script)  No further CM needs.

## 2013-05-23 NOTE — Progress Notes (Signed)
Zofran 4mg  [facility] .[facility].Patient called and updated Medication has been called in.Patient thankfull for CM help.

## 2013-05-23 NOTE — Progress Notes (Signed)
Incoming call from patient . Demographics verified in EPIC.Patient reports she was seen in ED last night for nausea and when she got home she did not have her script for Zofran.[facility]  [facility]CM will speak to PA in FAST TRACK.

## 2013-05-24 LAB — URINE CULTURE
Colony Count: NO GROWTH
Culture: NO GROWTH

## 2013-05-25 LAB — GC/CHLAMYDIA PROBE AMP
CT Probe RNA: NEGATIVE
GC Probe RNA: NEGATIVE

## 2014-03-12 ENCOUNTER — Encounter (HOSPITAL_COMMUNITY): Payer: Self-pay | Admitting: Emergency Medicine

## 2014-03-27 ENCOUNTER — Encounter: Payer: Self-pay | Admitting: Family Medicine

## 2014-03-27 ENCOUNTER — Ambulatory Visit (INDEPENDENT_AMBULATORY_CARE_PROVIDER_SITE_OTHER): Payer: No Typology Code available for payment source | Admitting: Family Medicine

## 2014-03-27 VITALS — BP 128/72 | HR 86 | Temp 98.6°F | Resp 16 | Ht 62.5 in | Wt 159.2 lb

## 2014-03-27 DIAGNOSIS — N898 Other specified noninflammatory disorders of vagina: Secondary | ICD-10-CM

## 2014-03-27 DIAGNOSIS — A499 Bacterial infection, unspecified: Secondary | ICD-10-CM

## 2014-03-27 DIAGNOSIS — R635 Abnormal weight gain: Secondary | ICD-10-CM

## 2014-03-27 DIAGNOSIS — Z113 Encounter for screening for infections with a predominantly sexual mode of transmission: Secondary | ICD-10-CM

## 2014-03-27 DIAGNOSIS — N760 Acute vaginitis: Secondary | ICD-10-CM

## 2014-03-27 DIAGNOSIS — Z124 Encounter for screening for malignant neoplasm of cervix: Secondary | ICD-10-CM

## 2014-03-27 DIAGNOSIS — B9689 Other specified bacterial agents as the cause of diseases classified elsewhere: Secondary | ICD-10-CM

## 2014-03-27 DIAGNOSIS — Z720 Tobacco use: Secondary | ICD-10-CM

## 2014-03-27 DIAGNOSIS — N6011 Diffuse cystic mastopathy of right breast: Secondary | ICD-10-CM

## 2014-03-27 DIAGNOSIS — N6012 Diffuse cystic mastopathy of left breast: Secondary | ICD-10-CM

## 2014-03-27 DIAGNOSIS — F17200 Nicotine dependence, unspecified, uncomplicated: Secondary | ICD-10-CM

## 2014-03-27 LAB — CBC WITH DIFFERENTIAL/PLATELET
Basophils Absolute: 0.1 10*3/uL (ref 0.0–0.1)
Basophils Relative: 1 % (ref 0–1)
Eosinophils Absolute: 0.1 10*3/uL (ref 0.0–0.7)
Eosinophils Relative: 2 % (ref 0–5)
HCT: 44.5 % (ref 36.0–46.0)
Hemoglobin: 15.4 g/dL — ABNORMAL HIGH (ref 12.0–15.0)
Lymphocytes Relative: 37 % (ref 12–46)
Lymphs Abs: 2.6 10*3/uL (ref 0.7–4.0)
MCH: 31.6 pg (ref 26.0–34.0)
MCHC: 34.6 g/dL (ref 30.0–36.0)
MCV: 91.4 fL (ref 78.0–100.0)
MPV: 9.3 fL — ABNORMAL LOW (ref 9.4–12.4)
Monocytes Absolute: 0.4 10*3/uL (ref 0.1–1.0)
Monocytes Relative: 6 % (ref 3–12)
Neutro Abs: 3.8 10*3/uL (ref 1.7–7.7)
Neutrophils Relative %: 54 % (ref 43–77)
Platelets: 299 10*3/uL (ref 150–400)
RBC: 4.87 MIL/uL (ref 3.87–5.11)
RDW: 12.6 % (ref 11.5–15.5)
WBC: 7 10*3/uL (ref 4.0–10.5)

## 2014-03-27 LAB — POCT WET PREP WITH KOH
KOH Prep POC: NEGATIVE
RBC Wet Prep HPF POC: NEGATIVE
Trichomonas, UA: NEGATIVE
Yeast Wet Prep HPF POC: NEGATIVE

## 2014-03-27 LAB — POCT URINALYSIS DIPSTICK
Bilirubin, UA: NEGATIVE
Glucose, UA: NEGATIVE
Ketones, UA: NEGATIVE
Leukocytes, UA: NEGATIVE
Nitrite, UA: NEGATIVE
Protein, UA: NEGATIVE
Spec Grav, UA: 1.02
Urobilinogen, UA: 1
pH, UA: 6.5

## 2014-03-27 LAB — POCT URINE PREGNANCY: Preg Test, Ur: NEGATIVE

## 2014-03-27 MED ORDER — LEVONORGEST-ETH ESTRAD 91-DAY 0.15-0.03 &0.01 MG PO TABS: 1.0000 | ORAL_TABLET | Freq: Every day | ORAL | Status: DC

## 2014-03-27 MED ORDER — METRONIDAZOLE 500 MG PO TABS: 500.0000 mg | ORAL_TABLET | Freq: Two times a day (BID) | ORAL | Status: DC

## 2014-03-27 NOTE — Progress Notes (Signed)
Subjective:    Patient ID: Aurelio JewKatrina A Rosemond, female    DOB: 06/30/1985, 30 y.o.   MRN: [id number]  HPI Patient presents today for Pap smear and contraceptive counseling. She has recently completed 2 courses of antibiotics for an upper respiratory infection and is concerned that she might be pregnant due to taking antibiotics with her OCPs. Would like a pregnancy test today. LMP- 2 1/2 months ago- takes Amethia. Having some thick white discharge for several weeks, itching/discomfort started yesterday.   Patient had testing for STIs 09/14/2012. 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 Patient has had a difficult time dealing with this information and reports that it resulted in the break up of her and her fiance. She states [facility] and her test for HSV came back negative. She is not sure exactly what test was run at [facility].   She had an US of her breasts 2/14 to evaluate pain and questionable mass. 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 The patient reports that she will occasionally have pain that she thinks is hormonal, and she is intermittently able to feel something in her breasts that comes and goes. She has noticed a decrease in discomfort and swelling when she watches her caffeine intake.   She is currently trying to quit smoking. Is trying nicotine patch.   Review of Systems As documented in CMA note- positive for urinary frequency, vaginal discharge and vaginal pain.  No fever/chills, no abdominal pain.     Objective:   Physical Exam  Constitutional: She is oriented to person, place, and time. She appears well-developed and well-nourished.  HENT:  Head: Normocephalic and atraumatic.  Right Ear: External ear normal.  Left Ear: External ear normal.  Nose: Nose normal.  Mouth/Throat: Oropharynx is clear and moist.  Eyes: Conjunctivae are normal. Pupils are equal, round, and reactive to light.  Neck: Normal range of  motion. Neck supple.  Cardiovascular: Normal rate, regular rhythm and normal heart sounds.   Pulmonary/Chest: Effort normal and breath sounds normal. Right breast exhibits no inverted nipple, no mass, no nipple discharge, no skin change and no tenderness. Left breast exhibits no inverted nipple, no mass, no nipple discharge, no skin change and no tenderness. Breasts are symmetrical.  Thickening in the left breast in the LUQ and the right breast above the areola.   Abdominal: Soft. Bowel sounds are normal.  Genitourinary: Uterus normal. Pelvic exam was performed with patient supine. There is no rash, tenderness, lesion or injury on the right labia. There is no rash, tenderness, lesion or injury on the left labia. Cervix exhibits no motion tenderness, no discharge and no friability. Right adnexum displays no mass, no tenderness and no fullness. Left adnexum displays no mass, no tenderness and no fullness. No erythema, tenderness or bleeding in the vagina. No foreign body around the vagina. No signs of injury around the vagina. Vaginal discharge: thin white.  Musculoskeletal: Normal range of motion.  Neurological: She is alert and oriented to person, place, and time. She has normal reflexes.  Skin: Skin is warm and dry.  Psychiatric: She has a normal mood and affect. Her behavior is normal. Judgment and thought content normal.  Vitals reviewed. BP 128/72 mmHg  Pulse 86  Temp(Src) 98.6 F (37 C) (Oral)  Resp 16  Ht 5' 2.5" (1.588 m)  Wt 159 lb 3.2 oz (72.213 kg)  BMI 28.64 kg/m2  SpO2 98%  Results for orders placed or performed in visit on 03/27/14  POCT urinalysis dipstick  Result Value Ref Range   Color, UA yellow    Clarity, UA clear    Glucose, UA neg    Bilirubin, UA neg    Ketones, UA neg    Spec Grav, UA 1.020    Blood, UA trace    pH, UA 6.5    Protein, UA neg    Urobilinogen, UA 1.0    Nitrite, UA neg    Leukocytes, UA Negative   POCT Wet Prep with KOH  Result Value Ref  Range   Trichomonas, UA Negative    Clue Cells Wet Prep HPF POC 7-18    Epithelial Wet Prep HPF POC 7-18    Yeast Wet Prep HPF POC neg    Bacteria Wet Prep HPF POC 1+    RBC Wet Prep HPF POC neg    WBC Wet Prep HPF POC 3-4    KOH Prep POC Negative   POCT urine pregnancy  Result Value Ref Range   Preg Test, Ur Negative      Assessment & Plan:  1. Screening for STD (sexually transmitted disease) - HIV antibody - RPR - Pap IG, CT/NG w/ reflex HPV when ASC-U - Discussed her previous HSV results and provided her a copy. She is going to compare this to what was done at an outside lab. We called [facility], but they did not have a record of testing for her.   2. Screening for cervical cancer - Pap IG, CT/NG w/ reflex HPV when ASC-U  3. Vaginal discharge - POCT urinalysis dipstick - POCT Wet Prep with KOH - CBC with Differential  4. Weight gain - TSH - POCT urine pregnancy  5. BV (bacterial vaginosis) - metroNIDAZOLE (FLAGYL) 500 MG tablet; Take 1 tablet (500 mg total) by mouth 2 (two) times daily.  Dispense: 14 tablet; Refill: 0 -She was instructed to avoid alcohol while taking and for 24 hours after finishing.  6. Tobacco use disorder -Provided encouragement and written smoking cessation tips for success. -Discussed risks of smoking and taking OCPs  7. Fibrocystic breast changes of both breasts -She will continue to check her breasts monthly and follow up if any changes or concerns -Per radiology recommendations, start screening mammo at age 28  8. Contraceptive counseling  . Levonorgestrel-Ethinyl Estradiol (AMETHIA) 0.15-0.03 &0.01 MG tablet    Sig: Take 1 tablet by mouth daily.    Dispense:  1 Package    Refill:  3   Emi Belfasteborah B. Malakai Schoenherr, FNP-BC  [facility]  03/29/2014 9:07 AM

## 2014-03-27 NOTE — Patient Instructions (Signed)
Move more, eat less bread, pasta, rice, potatoes and sweets Increase vegetables and lean proteins.  Smoking Cessation, Tips for Success If you are ready to quit smoking, congratulations! You have chosen to help yourself be healthier. Cigarettes bring nicotine, tar, carbon monoxide, and other irritants into your body. Your lungs, heart, and blood vessels will be able to work better without these poisons. There are many different ways to quit smoking. Nicotine gum, nicotine patches, a nicotine inhaler, or nicotine nasal spray can help with physical craving. Hypnosis, support groups, and medicines help break the habit of smoking. WHAT THINGS CAN I DO TO MAKE QUITTING EASIER?  Here are some tips to help you quit for good:  Pick a date when you will quit smoking completely. Tell all of your friends and family about your plan to quit on that date.  Do not try to slowly cut down on the number of cigarettes you are smoking. Pick a quit date and quit smoking completely starting on that day.  Throw away all cigarettes.   Clean and remove all ashtrays from your home, work, and car.  On a card, write down your reasons for quitting. Carry the card with you and read it when you get the urge to smoke.  Cleanse your body of nicotine. Drink enough water and fluids to keep your urine clear or pale yellow. Do this after quitting to flush the nicotine from your body.  Learn to predict your moods. Do not let a bad situation be your excuse to have a cigarette. Some situations in your life might tempt you into wanting a cigarette.  Never have "just one" cigarette. It leads to wanting another and another. Remind yourself of your decision to quit.  Change habits associated with smoking. If you smoked while driving or when feeling stressed, try other activities to replace smoking. Stand up when drinking your coffee. Brush your teeth after eating. Sit in a different chair when you read the paper. Avoid alcohol while  trying to quit, and try to drink fewer caffeinated beverages. Alcohol and caffeine may urge you to smoke.  Avoid foods and drinks that can trigger a desire to smoke, such as sugary or spicy foods and alcohol.  Ask people who smoke not to smoke around you.  Have something planned to do right after eating or having a cup of coffee. For example, plan to take a walk or exercise.  Try a relaxation exercise to calm you down and decrease your stress. Remember, you may be tense and nervous for the first 2 weeks after you quit, but this will pass.  Find new activities to keep your hands busy. Play with a pen, coin, or rubber band. Doodle or draw things on paper.  Brush your teeth right after eating. This will help cut down on the craving for the taste of tobacco after meals. You can also try mouthwash.   Use oral substitutes in place of cigarettes. Try using lemon drops, carrots, cinnamon sticks, or chewing gum. Keep them handy so they are available when you have the urge to smoke.  When you have the urge to smoke, try deep breathing.  Designate your home as a nonsmoking area.  If you are a heavy smoker, ask your health care provider about a prescription for nicotine chewing gum. It can ease your withdrawal from nicotine.  Reward yourself. Set aside the cigarette money you save and buy yourself something nice.  Look for support from others. Join a support group or  smoking cessation program. Ask someone at home or at work to help you with your plan to quit smoking.  Always ask yourself, "Do I need this cigarette or is this just a reflex?" Tell yourself, "Today, I choose not to smoke," or "I do not want to smoke." You are reminding yourself of your decision to quit.  Do not replace cigarette smoking with electronic cigarettes (commonly called e-cigarettes). The safety of e-cigarettes is unknown, and some may contain harmful chemicals.  If you relapse, do not give up! Plan ahead and think about  what you will do the next time you get the urge to smoke. HOW WILL I FEEL WHEN I QUIT SMOKING? You may have symptoms of withdrawal because your body is used to nicotine (the addictive substance in cigarettes). You may crave cigarettes, be irritable, feel very hungry, cough often, get headaches, or have difficulty concentrating. The withdrawal symptoms are only temporary. They are strongest when you first quit but will go away within 10-14 days. When withdrawal symptoms occur, stay in control. Think about your reasons for quitting. Remind yourself that these are signs that your body is healing and getting used to being without cigarettes. Remember that withdrawal symptoms are easier to treat than the major diseases that smoking can cause.  Even after the withdrawal is over, expect periodic urges to smoke. However, these cravings are generally short lived and will go away whether you smoke or not. Do not smoke! WHAT RESOURCES ARE AVAILABLE TO HELP ME QUIT SMOKING? Your health care provider can direct you to community resources or hospitals for support, which may include:  Group support.  Education.  Hypnosis.  Therapy. Document Released: 01/24/2004 Document Revised: 09/11/2013 Document Reviewed: 10/13/2012 [facility] Patient Information 2015 [place], [place]. This information is not intended to replace advice given to you by your health care provider. Make sure you discuss any questions you have with your health care provider. Fibrocystic Breast Changes Fibrocystic breast changes occur when breast ducts become blocked, causing painful, fluid-filled lumps (cysts) to form in the breast. This is a common condition that is noncancerous (benign). It occurs when women go through hormonal changes during their menstrual cycle. Fibrocystic breast changes can affect one or both breasts. CAUSES  The exact cause of fibrocystic breast changes is not known, but it may be related to the female hormones estrogen and  progesterone. Family traits that get passed from parent to child (genetics) may also be a factor in some cases. SIGNS AND SYMPTOMS   Tenderness, mild discomfort, or pain.   Swelling.   Ropelike feeling when touching the breast.   Lumpy breast, one or both sides.   Changes in breast size, especially before (larger) and after (smaller) the menstrual period.   Green or dark brown nipple discharge (not blood).  Symptoms are usually worse before menstrual periods start and get better toward the end of the menstrual period.  DIAGNOSIS  To make a diagnosis, your health care provider will ask you questions and perform a physical exam of your breasts. The health care provider may recommend other tests that can examine inside your breasts, such as:  A breast X-ray (mammogram).   Ultrasonography.  An MRI.  If something more than fibrocystic breast changes is suspected, your health care provider may take a breast tissue sample (breast biopsy) to examine. TREATMENT  Often, treatment is not needed. Your health care provider may recommend over-the-counter pain relievers to help lessen pain or discomfort caused by the fibrocystic breast changes. You  may also be asked to change your diet to limit or stop eating foods or drinking beverages that contain caffeine. Foods and beverages that contain caffeine include chocolate, soda, coffee, and tea. Reducing sugar and fat in your diet may also help. Your health care provider may also recommend:  Fine needle aspiration to remove fluid from a cyst that is causing pain.   Surgery to remove a large, persistent, and tender cyst. HOME CARE INSTRUCTIONS   Examine your breasts after every menstrual period. If you do not have menstrual periods, check your breasts the first day of every month. Feel for changes, such as more tenderness, a new growth, a change in breast size, or a change in a lump that has always been there.   Only take over-the-counter or  prescription medicine as directed by your health care provider.   Wear a well-fitted support or sports bra, especially when exercising.   Decrease or avoid caffeine, fat, and sugar in your diet as directed by your health care provider.  SEEK MEDICAL CARE IF:   You have fluid leaking (discharge) from your nipples, especially bloody discharge.   You have new lumps or bumps in the breast.   Your breast or breasts become enlarged, red, and painful.   You have areas of your breast that pucker in.   Your nipples appear flat or indented.  Document Released: 02/11/2006 Document Revised: 05/02/2013 Document Reviewed: 10/16/2012 [facility] Patient Information 2015 [place], [place]. This information is not intended to replace advice given to you by your health care provider. Make sure you discuss any questions you have with your health care provider.

## 2014-03-27 NOTE — Progress Notes (Signed)
   Subjective:    Patient ID: Jill Burke, female    DOB: 02/17/1986, 28 y.o.   MRN: [id number]  HPI    Review of Systems  Constitutional: Negative.   HENT: Negative.   Eyes: Negative.   Respiratory: Negative.   Cardiovascular: Negative.   Gastrointestinal: Negative.   Endocrine: Negative.   Genitourinary: Positive for frequency, vaginal discharge, vaginal pain and pelvic pain.  Musculoskeletal: Negative.   Skin: Negative.   Allergic/Immunologic: Negative.   Neurological: Negative.   Hematological: Negative.   Psychiatric/Behavioral: Negative.        Objective:   Physical Exam        Assessment & Plan:

## 2014-03-28 LAB — TSH: TSH: 1.219 u[IU]/mL (ref 0.350–4.500)

## 2014-03-28 LAB — RPR

## 2014-03-28 LAB — HIV ANTIBODY (ROUTINE TESTING W REFLEX): HIV 1&2 Ab, 4th Generation: NONREACTIVE

## 2014-03-30 LAB — PAP IG, CT-NG, RFX HPV ASCU
Chlamydia Probe Amp: NEGATIVE
GC Probe Amp: NEGATIVE

## 2014-03-30 LAB — HUMAN PAPILLOMAVIRUS, HIGH RISK: HPV DNA High Risk: NOT DETECTED

## 2014-04-02 ENCOUNTER — Encounter: Payer: BC Managed Care – PPO | Admitting: Family Medicine

## 2014-04-03 ENCOUNTER — Other Ambulatory Visit: Payer: Self-pay | Admitting: Family Medicine

## 2014-04-03 ENCOUNTER — Telehealth: Payer: Self-pay | Admitting: Radiology

## 2014-04-03 ENCOUNTER — Encounter: Payer: Self-pay | Admitting: Family Medicine

## 2014-04-03 DIAGNOSIS — R87610 Atypical squamous cells of undetermined significance on cytologic smear of cervix (ASC-US): Secondary | ICD-10-CM

## 2014-04-03 NOTE — Telephone Encounter (Signed)
Pt called back regarding lab results. Please advise.

## 2014-04-03 NOTE — Telephone Encounter (Signed)
Called patient and reviewed results. Will refer to gyn- she has not provider preference.

## 2014-04-03 NOTE — Telephone Encounter (Signed)
Pended referral.  Please advise if specific provider.

## 2014-04-03 NOTE — Telephone Encounter (Signed)
Gave pt lab results. She doesn't have a GYN. Can you refer her to wherever you want her to go?

## 2014-04-10 ENCOUNTER — Encounter: Payer: Self-pay | Admitting: Family Medicine

## 2014-04-15 ENCOUNTER — Telehealth: Payer: Self-pay | Admitting: *Deleted

## 2014-04-15 NOTE — Telephone Encounter (Signed)
-----   Message from Johnnette LitterErin M Cardwell, [place]MA sent at 03/31/2014  2:18 PM EST -----   ----- Message -----    From: Emi Belfasteborah B Gessner, FNP    Sent: 03/30/2014  10:24 PM      To: [facility] Lab Pool  Please call patient and tell her that her blood count, thyroid test and screening for sexually transmitted infections (HIV, syphillis, chlamydia and gonorrhea) are all normal/negative. She did have some abnormal cells on her PAP test and needs to see a gynecologist for further testing. If she does not have a gynecologist, we can put in a referral for her.

## 2014-04-15 NOTE — Telephone Encounter (Signed)
Patient states she went to GYN and was told if HPV was negative she does not need further testing. She wants to know what other test are needed.

## 2014-04-16 ENCOUNTER — Other Ambulatory Visit: Payer: Self-pay | Admitting: Family Medicine

## 2014-04-16 DIAGNOSIS — R110 Nausea: Secondary | ICD-10-CM

## 2014-04-16 DIAGNOSIS — N926 Irregular menstruation, unspecified: Secondary | ICD-10-CM

## 2014-04-16 NOTE — Telephone Encounter (Signed)
Called patient and discussed results and visit with gyn. Patient requests quantitative HcG since she had taken 2 courses of antibiotics while on OCPs and is concerned that she may have become pregnant. Order placed.

## 2014-04-23 ENCOUNTER — Ambulatory Visit: Payer: No Typology Code available for payment source | Admitting: Gynecology

## 2014-09-25 ENCOUNTER — Inpatient Hospital Stay (HOSPITAL_COMMUNITY)
Admission: AD | Admit: 2014-09-25 | Discharge: 2014-09-25 | Disposition: A | Discharge status: Home / Self Care | Payer: No Typology Code available for payment source | Source: Ambulatory Visit | Attending: Obstetrics and Gynecology | Admitting: Obstetrics and Gynecology

## 2014-09-25 ENCOUNTER — Inpatient Hospital Stay (HOSPITAL_COMMUNITY): Payer: No Typology Code available for payment source

## 2014-09-25 ENCOUNTER — Encounter (HOSPITAL_COMMUNITY): Payer: Self-pay | Admitting: *Deleted

## 2014-09-25 DIAGNOSIS — O26899 Other specified pregnancy related conditions, unspecified trimester: Secondary | ICD-10-CM

## 2014-09-25 DIAGNOSIS — R1031 Right lower quadrant pain: Secondary | ICD-10-CM | POA: Diagnosis not present

## 2014-09-25 DIAGNOSIS — F17210 Nicotine dependence, cigarettes, uncomplicated: Secondary | ICD-10-CM | POA: Diagnosis not present

## 2014-09-25 DIAGNOSIS — R109 Unspecified abdominal pain: Secondary | ICD-10-CM

## 2014-09-25 DIAGNOSIS — M545 Low back pain: Secondary | ICD-10-CM | POA: Insufficient documentation

## 2014-09-25 DIAGNOSIS — R102 Pelvic and perineal pain: Secondary | ICD-10-CM

## 2014-09-25 HISTORY — DX: Unspecified abdominal pain: R10.9

## 2014-09-25 LAB — URINALYSIS, ROUTINE W REFLEX MICROSCOPIC
Bilirubin Urine: NEGATIVE
Glucose, UA: 250 mg/dL — AB
Ketones, ur: NEGATIVE mg/dL
Leukocytes, UA: NEGATIVE
Nitrite: NEGATIVE
Protein, ur: NEGATIVE mg/dL
Specific Gravity, Urine: 1.025 (ref 1.005–1.030)
Urobilinogen, UA: 0.2 mg/dL (ref 0.0–1.0)
pH: 6.5 (ref 5.0–8.0)

## 2014-09-25 LAB — COMPREHENSIVE METABOLIC PANEL
ALT: 11 U/L — ABNORMAL LOW (ref 14–54)
AST: 16 U/L (ref 15–41)
Albumin: 4.1 g/dL (ref 3.5–5.0)
Alkaline Phosphatase: 45 U/L (ref 38–126)
Anion gap: 5 (ref 5–15)
BUN: 14 mg/dL (ref 6–20)
CO2: 22 mmol/L (ref 22–32)
Calcium: 8.9 mg/dL (ref 8.9–10.3)
Chloride: 109 mmol/L (ref 101–111)
Creatinine, Ser: 0.77 mg/dL (ref 0.44–1.00)
GFR calc Af Amer: >60 mL/min (ref >60)
GFR calc non Af Amer: >60 mL/min (ref >60)
Glucose, Bld: 90 mg/dL (ref 65–99)
Potassium: 4.2 mmol/L (ref 3.5–5.1)
Sodium: 136 mmol/L (ref 135–145)
Total Bilirubin: 0.3 mg/dL (ref 0.3–1.2)
Total Protein: 6.6 g/dL (ref 6.5–8.1)

## 2014-09-25 LAB — CBC WITH DIFFERENTIAL/PLATELET
Basophils Absolute: 0 10*3/uL (ref 0.0–0.1)
Basophils Relative: 0 % (ref 0–1)
Eosinophils Absolute: 0.1 10*3/uL (ref 0.0–0.7)
Eosinophils Relative: 2 % (ref 0–5)
HCT: 41.4 % (ref 36.0–46.0)
Hemoglobin: 14.2 g/dL (ref 12.0–15.0)
Lymphocytes Relative: 38 % (ref 12–46)
Lymphs Abs: 2.7 10*3/uL (ref 0.7–4.0)
MCH: 32.4 pg (ref 26.0–34.0)
MCHC: 34.3 g/dL (ref 30.0–36.0)
MCV: 94.5 fL (ref 78.0–100.0)
Monocytes Absolute: 0.4 10*3/uL (ref 0.1–1.0)
Monocytes Relative: 6 % (ref 3–12)
Neutro Abs: 3.9 10*3/uL (ref 1.7–7.7)
Neutrophils Relative %: 54 % (ref 43–77)
Platelets: 247 10*3/uL (ref 150–400)
RBC: 4.38 MIL/uL (ref 3.87–5.11)
RDW: 12.7 % (ref 11.5–15.5)
WBC: 7.1 10*3/uL (ref 4.0–10.5)

## 2014-09-25 LAB — URINE MICROSCOPIC-ADD ON

## 2014-09-25 LAB — HCG, QUANTITATIVE, PREGNANCY: hCG, Beta Chain, Quant, S: 2 m[IU]/mL (ref <5)

## 2014-09-25 MED ORDER — ONDANSETRON HCL 4 MG/2ML IJ SOLN
4.0000 mg | Freq: Once | INTRAMUSCULAR | Status: AC
  Administered 2014-09-25: 4 mg via INTRAVENOUS
  Filled 2014-09-25: qty 2

## 2014-09-25 MED ORDER — LACTATED RINGERS IV BOLUS (SEPSIS)
250.0000 mL | Freq: Once | INTRAVENOUS | Status: AC
  Administered 2014-09-25: 250 mL via INTRAVENOUS

## 2014-09-25 MED ORDER — HYDROMORPHONE HCL 1 MG/ML IJ SOLN
1.0000 mg | INTRAMUSCULAR | Status: DC | PRN
  Administered 2014-09-25: 1 mg via INTRAVENOUS
  Filled 2014-09-25: qty 1

## 2014-09-25 MED ORDER — TRAMADOL HCL 50 MG PO TABS: 50.0000 mg | ORAL_TABLET | Freq: Four times a day (QID) | ORAL | Status: DC | PRN

## 2014-09-25 MED ORDER — TRAMADOL HCL 50 MG PO TABS: 50.0000 mg | ORAL_TABLET | Freq: Four times a day (QID) | ORAL | Status: AC | PRN

## 2014-09-25 MED ORDER — LACTATED RINGERS IV SOLN
INTRAVENOUS | Status: DC
  Administered 2014-09-25: 14:00:00 via INTRAVENOUS

## 2014-09-25 NOTE — MAU Provider Note (Signed)
History   29 yo [id number] presented unannounced c/o low back pain, wrapping around right hip to lower abdomen, mild nausea.  Had ? "faint" positive UPTs last week, had missed several pills from her continuous dosing regimen.  Had episode of spotting 2 weeks ago, which was unusual for patient since starting continuous dosing OCPs approx 2 years ago.  Denies fever, vomiting recently.  Denies risk of STDs.  [facility].  Had called the office on 08/29/14 with abdominal pain, was instructed to f/u for appt for evaluation, but did not.  Reports normal appetite.  Patient Active Problem List   Diagnosis Date Noted  . Abdominal pain affecting pregnancy 09/25/2014  . Left breast lump 06/21/2012  . Breast pain, right 06/21/2012  [facility] age 29.   HPI:  As above  OB History    Gravida Para Term Preterm AB TAB SAB Ectopic Multiple Living   3 3 3       3       Past Medical History  Diagnosis Date  . Chlamydia 2007    treated    History reviewed. No pertinent past surgical history.  Family History  Problem Relation Age of Onset  . Hypertension Father   . Heart disease Father   . Hyperlipidemia Father   . Stroke Father   . Diabetes Paternal Grandmother   . Diabetes Mother     History  Substance Use Topics  . Smoking status: Current Every Day Smoker -- 0.50 packs/day for 3 years  . Smokeless tobacco: Never Used  . Alcohol Use: No    Allergies:  Allergies  Allergen Reactions  . Codeine Other (See Comments)    Does not remember     Prescriptions prior to admission  Medication Sig Dispense Refill Last Dose  . acetaminophen (TYLENOL) 500 MG tablet Take 1,500 mg by mouth every 6 (six) hours as needed for moderate pain or headache.    09/25/2014 at Unknown time  . aspirin-acetaminophen-caffeine (EXCEDRIN MIGRAINE) 250-250-65 MG per tablet Take 2 tablets by mouth  every 6 (six) hours as needed for headache.   Past Week at Unknown time  . Levonorgestrel-Ethinyl Estradiol (AMETHIA) 0.15-0.03 &0.01 MG tablet Take 1 tablet by mouth daily. 1 Package 3 09/25/2014 at Unknown time  . metroNIDAZOLE (FLAGYL) 500 MG tablet Take 1 tablet (500 mg total) by mouth 2 (two) times daily. (Patient not taking: Reported on 09/25/2014) 14 tablet 0     ROS: Abdominal and back pain, nausea Physical Exam   Blood pressure 110/79, pulse 78, temperature 98.2 F (36.8 C), temperature source Oral, resp. rate 18, height 5\' 1"  (1.549 m), weight 73.539 kg (162 lb 2 oz), last menstrual period 07/10/2014.    Physical Exam  In mild distress with back pain Chest clear Heart RRR without murmur Abd soft, NT, no rebound or guarding No CVAT Pelvic--no CMT, no d/c in vault, cervix closed, Uterus small, NT Ext WNL  Results for orders placed or performed during the hospital encounter of 09/25/14 (from the past 24 hour(s))  Urinalysis, Routine w reflex microscopic     Status: Abnormal   Collection Time: 09/25/14  1:08 PM  Result Value Ref Range   Color, Urine YELLOW YELLOW   APPearance CLEAR CLEAR   Specific Gravity, Urine 1.025 1.005 - 1.030   pH 6.5 5.0 - 8.0   Glucose, UA 250 (A) NEGATIVE mg/dL   Hgb urine  dipstick TRACE (A) NEGATIVE   Bilirubin Urine NEGATIVE NEGATIVE   Ketones, ur NEGATIVE NEGATIVE mg/dL   Protein, ur NEGATIVE NEGATIVE mg/dL   Urobilinogen, UA 0.2 0.0 - 1.0 mg/dL   Nitrite NEGATIVE NEGATIVE   Leukocytes, UA NEGATIVE NEGATIVE  Urine microscopic-add on     Status: Abnormal   Collection Time: 09/25/14  1:08 PM  Result Value Ref Range   Squamous Epithelial / LPF FEW (A) RARE   WBC, UA 0-2 <3 WBC/hpf   RBC / HPF 3-6 <3 RBC/hpf   Bacteria, UA RARE RARE   Urine-Other MUCOUS PRESENT   CBC with Differential/Platelet     Status: None   Collection Time: 09/25/14  1:30 PM  Result Value Ref Range   WBC 7.1 4.0 - 10.5 K/uL   RBC 4.38 3.87 - 5.11 MIL/uL    Hemoglobin 14.2 12.0 - 15.0 g/dL   HCT 16.141.4 09.636.0 - 04.546.0 %   MCV 94.5 78.0 - 100.0 fL   MCH 32.4 26.0 - 34.0 pg   MCHC 34.3 30.0 - 36.0 g/dL   RDW 40.912.7 81.111.5 - 91.415.5 %   Platelets 247 150 - 400 K/uL   Neutrophils Relative % 54 43 - 77 %   Neutro Abs 3.9 1.7 - 7.7 K/uL   Lymphocytes Relative 38 12 - 46 %   Lymphs Abs 2.7 0.7 - 4.0 K/uL   Monocytes Relative 6 3 - 12 %   Monocytes Absolute 0.4 0.1 - 1.0 K/uL   Eosinophils Relative 2 0 - 5 %   Eosinophils Absolute 0.1 0.0 - 0.7 K/uL   Basophils Relative 0 0 - 1 %   Basophils Absolute 0.0 0.0 - 0.1 K/uL  Comprehensive metabolic panel     Status: Abnormal   Collection Time: 09/25/14  1:30 PM  Result Value Ref Range   Sodium 136 135 - 145 mmol/L   Potassium 4.2 3.5 - 5.1 mmol/L   Chloride 109 101 - 111 mmol/L   CO2 22 22 - 32 mmol/L   Glucose, Bld 90 65 - 99 mg/dL   BUN 14 6 - 20 mg/dL   Creatinine, Ser 7.820.77 0.44 - 1.00 mg/dL   Calcium 8.9 8.9 - 95.610.3 mg/dL   Total Protein 6.6 6.5 - 8.1 g/dL   Albumin 4.1 3.5 - 5.0 g/dL   AST 16 15 - 41 U/L   ALT 11 (L) 14 - 54 U/L   Alkaline Phosphatase 45 38 - 126 U/L   Total Bilirubin 0.3 0.3 - 1.2 mg/dL   GFR calc non Af Amer >60 >60 mL/min   GFR calc Af Amer >60 >60 mL/min   Anion gap 5 5 - 15  hCG, quantitative, pregnancy     Status: None   Collection Time: 09/25/14  1:30 PM  Result Value Ref Range   hCG, Beta Chain, Quant, S 2 <5 mIU/mL   GC/chlamydia pending Urine culture pending.  Pain relieved after Dilaudid 1 mg dose. Zofran for nausea--no vomiting, nausea resolved  ED Course  Assessment: Back and abdominal pain No evidence pregnancy Normal labs  Plan: Offered pelvic US in MAU--patient declined, would prefer to f/u in the office. Recommended f/u visit at [facility] in 1 week for repeat UPT/QHCG and pelvic US due to RLQ pain. Rx Tramadol 50 mg po q 6 hours prn pain, #30, 1 refill. Office will f/u with patient to schedule f/u visit in 1 week. Patient to call prn with any worsening  of sx.   Ray ChurchLATHAM, Kamy Poinsett CNM, MSN  09/25/2014 3:14 PM

## 2014-09-25 NOTE — Discharge Instructions (Signed)
Abdominal Pain, Women °Abdominal (stomach, pelvic, or belly) pain can be caused by many things. It is important to tell your doctor: °· The location of the pain. °· Does it come and go or is it present all the time? °· Are there things that start the pain (eating certain foods, exercise)? °· Are there other symptoms associated with the pain (fever, nausea, vomiting, diarrhea)? °All of this is helpful to know when trying to find the cause of the pain. °CAUSES  °· Stomach: virus or bacteria infection, or ulcer. °· Intestine: appendicitis (inflamed appendix), regional ileitis (Crohn's disease), ulcerative colitis (inflamed colon), irritable bowel syndrome, diverticulitis (inflamed diverticulum of the colon), or cancer of the stomach or intestine. °· Gallbladder disease or stones in the gallbladder. °· Kidney disease, kidney stones, or infection. °· Pancreas infection or cancer. °· Fibromyalgia (pain disorder). °· Diseases of the female organs: °¨ Uterus: fibroid (non-cancerous) tumors or infection. °¨ Fallopian tubes: infection or tubal pregnancy. °¨ Ovary: cysts or tumors. °¨ Pelvic adhesions (scar tissue). °¨ Endometriosis (uterus lining tissue growing in the pelvis and on the pelvic organs). °¨ Pelvic congestion syndrome (female organs filling up with blood just before the menstrual period). °¨ Pain with the menstrual period. °¨ Pain with ovulation (producing an egg). °¨ Pain with an IUD (intrauterine device, birth control) in the uterus. °¨ Cancer of the female organs. °· Functional pain (pain not caused by a disease, may improve without treatment). °· Psychological pain. °· Depression. °DIAGNOSIS  °Your doctor will decide the seriousness of your pain by doing an examination. °· Blood tests. °· X-rays. °· Ultrasound. °· CT scan (computed tomography, special type of X-ray). °· MRI (magnetic resonance imaging). °· Cultures, for infection. °· Barium enema (dye inserted in the large intestine, to better view it with  X-rays). °· Colonoscopy (looking in intestine with a lighted tube). °· Laparoscopy (minor surgery, looking in abdomen with a lighted tube). °· Major abdominal exploratory surgery (looking in abdomen with a large incision). °TREATMENT  °The treatment will depend on the cause of the pain.  °· Many cases can be observed and treated at home. °· Over-the-counter medicines recommended by your caregiver. °· Prescription medicine. °· Antibiotics, for infection. °· Birth control pills, for painful periods or for ovulation pain. °· Hormone treatment, for endometriosis. °· Nerve blocking injections. °· Physical therapy. °· Antidepressants. °· Counseling with a psychologist or psychiatrist. °· Minor or major surgery. °HOME CARE INSTRUCTIONS  °· Do not take laxatives, unless directed by your caregiver. °· Take over-the-counter pain medicine only if ordered by your caregiver. Do not take aspirin because it can cause an upset stomach or bleeding. °· Try a clear liquid diet (broth or water) as ordered by your caregiver. Slowly move to a bland diet, as tolerated, if the pain is related to the stomach or intestine. °· Have a thermometer and take your temperature several times a day, and record it. °· Bed rest and sleep, if it helps the pain. °· Avoid sexual intercourse, if it causes pain. °· Avoid stressful situations. °· Keep your follow-up appointments and tests, as your caregiver orders. °· If the pain does not go away with medicine or surgery, you may try: °¨ Acupuncture. °¨ Relaxation exercises (yoga, meditation). °¨ Group therapy. °¨ Counseling. °SEEK MEDICAL CARE IF:  °· You notice certain foods cause stomach pain. °· Your home care treatment is not helping your pain. °· You need stronger pain medicine. °· You want your IUD removed. °· You feel faint or   lightheaded. °· You develop nausea and vomiting. °· You develop a rash. °· You are having side effects or an allergy to your medicine. °SEEK IMMEDIATE MEDICAL CARE IF:  °· Your  pain does not go away or gets worse. °· You have a fever. °· Your pain is felt only in portions of the abdomen. The right side could possibly be appendicitis. The left lower portion of the abdomen could be colitis or diverticulitis. °· You are passing blood in your stools (bright red or black tarry stools, with or without vomiting). °· You have blood in your urine. °· You develop chills, with or without a fever. °· You pass out. °MAKE SURE YOU:  °· Understand these instructions. °· Will watch your condition. °· Will get help right away if you are not doing well or get worse. °Document Released: 02/22/2007 Document Revised: 09/11/2013 Document Reviewed: 03/14/2009 °ExitCare® Patient Information ©2015 [employer], LLC. This information is not intended to replace advice given to you by your health care provider. Make sure you discuss any questions you have with your health care provider. ° °

## 2014-09-25 NOTE — MAU Note (Signed)
PT SAYS SHE DID  HPT ON 5-  2-  FAINT POSITIVE.  THEN   2 MORE   ON 5-4-  STILL FAINT POSITVE.     TAKES BCP'S.    WAS SICK IN MARCH AND ON ANTX-   DID NOT TAKE 6 PILLS        SHE HAD BLEEDING  ON 5-1- LASTED 3 DAYS- [facility]  - CALLED   DR.       THEN ON 4-1-  SHE BLED FOR 1-2  DAYS .       [facility] [place] [place]   PAIN  IN MID-LOWER BACK- RADIATES TO RIGHT  FRONT.    NO PAIN ON LEFT SIDE.  PAIN STARTED  YESTERDAY AT  230PM.     TOOK XS TYLENOL 3 TABS  YESTERDAY   2X .      AND 3  TABS  TODAY.    SHE CALLED  DR TODAY-  TOLD  TO COME IN.

## 2014-09-26 LAB — URINE CULTURE: Colony Count: 15000

## 2014-10-09 ENCOUNTER — Emergency Department (HOSPITAL_COMMUNITY): Payer: No Typology Code available for payment source

## 2014-10-09 ENCOUNTER — Emergency Department (HOSPITAL_COMMUNITY)
Admission: EM | Admit: 2014-10-09 | Discharge: 2014-10-09 | Disposition: A | Discharge status: Home / Self Care | Payer: No Typology Code available for payment source | Attending: Emergency Medicine | Admitting: Emergency Medicine

## 2014-10-09 ENCOUNTER — Encounter (HOSPITAL_COMMUNITY): Payer: Self-pay | Admitting: Emergency Medicine

## 2014-10-09 DIAGNOSIS — Z8619 Personal history of other infectious and parasitic diseases: Secondary | ICD-10-CM | POA: Insufficient documentation

## 2014-10-09 DIAGNOSIS — Z720 Tobacco use: Secondary | ICD-10-CM | POA: Insufficient documentation

## 2014-10-09 DIAGNOSIS — Y9389 Activity, other specified: Secondary | ICD-10-CM | POA: Insufficient documentation

## 2014-10-09 DIAGNOSIS — Y998 Other external cause status: Secondary | ICD-10-CM | POA: Diagnosis not present

## 2014-10-09 DIAGNOSIS — Y92095 Swimming-pool of other non-institutional residence as the place of occurrence of the external cause: Secondary | ICD-10-CM | POA: Diagnosis not present

## 2014-10-09 DIAGNOSIS — S8991XA Unspecified injury of right lower leg, initial encounter: Secondary | ICD-10-CM | POA: Insufficient documentation

## 2014-10-09 DIAGNOSIS — Z793 Long term (current) use of hormonal contraceptives: Secondary | ICD-10-CM | POA: Diagnosis not present

## 2014-10-09 DIAGNOSIS — X58XXXA Exposure to other specified factors, initial encounter: Secondary | ICD-10-CM | POA: Insufficient documentation

## 2014-10-09 MED ORDER — KETOROLAC TROMETHAMINE 60 MG/2ML IM SOLN
60.0000 mg | Freq: Once | INTRAMUSCULAR | Status: AC
  Administered 2014-10-09: 60 mg via INTRAMUSCULAR
  Filled 2014-10-09: qty 2

## 2014-10-09 MED ORDER — HYDROCODONE-ACETAMINOPHEN 5-325 MG PO TABS: 2.0000 | ORAL_TABLET | ORAL | Status: DC | PRN

## 2014-10-09 NOTE — ED Provider Notes (Signed)
CSN: [id number]     Arrival date & time 10/09/14  1557 History  This chart was scribed for a non-physician practitioner, Oswaldo ConroyVictoria Jasmon Graffam, PA-C working with Lorre NickAnthony Allen, MD by Evon Slackerrance Branch, ED Scribe. This patient was seen in room WTR7/WTR7 and the patient's care was started at 6:15 PM.    Chief Complaint  Patient presents with  . Knee Pain   The history is provided by the patient. No language interpreter was used.   HPI Comments: Jill Burke is a 29 y.o. female who presents to the Emergency Department complaining of new right knee pain onset 1 day prior. Pt states she has associated swelling and slight tingling radiating down her leg. Pt states she feels as if she "popped the knee out" yesterday at the pool. Pt states that the pain is worse when trying to ambulate. Pt states she us unable to ambulate normally due to pain. Pt states she has tried applying ice with no relief. Pt denies fever, chills, nausea or vomiting.    Past Medical History  Diagnosis Date  . Chlamydia 2007    treated   History reviewed. No pertinent past surgical history. Family History  Problem Relation Age of Onset  . Hypertension Father   . Heart disease Father   . Hyperlipidemia Father   . Stroke Father   . Diabetes Paternal Grandmother   . Diabetes Mother    History  Substance Use Topics  . Smoking status: Current Every Day Smoker -- 0.50 packs/day for 3 years  . Smokeless tobacco: Never Used  . Alcohol Use: No   OB History    Gravida Para Term Preterm AB TAB SAB Ectopic Multiple Living   3 3 3       3       Review of Systems  Constitutional: Negative for fever and chills.  Gastrointestinal: Negative for nausea and vomiting.  Musculoskeletal: Positive for joint swelling and arthralgias.     Allergies  Codeine  Home Medications   Prior to Admission medications   Medication Sig Start Date End Date Taking? Authorizing Provider  acetaminophen (TYLENOL) 500 MG tablet Take 1,500 mg by  mouth every 6 (six) hours as needed for moderate pain or headache.     Historical Provider, MD  aspirin-acetaminophen-caffeine (EXCEDRIN MIGRAINE) [phone] MG per tablet Take 2 tablets by mouth every 6 (six) hours as needed for headache.    Historical Provider, MD  HYDROcodone-acetaminophen (NORCO/VICODIN) 5-325 MG per tablet Take 2 tablets by mouth every 4 (four) hours as needed. 10/09/14   Oswaldo ConroyVictoria Nimah Uphoff, PA-C  Levonorgestrel-Ethinyl Estradiol (AMETHIA) 0.15-0.03 &0.01 MG tablet Take 1 tablet by mouth daily. 03/27/14   Emi Belfasteborah B Gessner, FNP  traMADol (ULTRAM) 50 MG tablet Take 1 tablet (50 mg total) by mouth every 6 (six) hours as needed. 09/25/14 09/25/15  Nigel BridgemanVicki Latham, CNM   BP 114/76 mmHg  Pulse 73  Temp(Src) 99.1 F (37.3 C) (Oral)  Resp 18  SpO2 99%  LMP 07/10/2014   Physical Exam  Constitutional: She appears well-developed and well-nourished. No distress.  HENT:  Head: Normocephalic and atraumatic.  Eyes: Conjunctivae are normal. Right eye exhibits no discharge. Left eye exhibits no discharge.  Cardiovascular:  Pulses:      Dorsalis pedis pulses are 2+ on the right side, and 2+ on the left side.       Posterior tibial pulses are 2+ on the right side, and 2+ on the left side.  DP and PT pulses 2+ equal bilaterally.  Pulmonary/Chest:  Effort normal. No respiratory distress.  Musculoskeletal: She exhibits tenderness.  tenderness to right medial joint line of right knee, no effusion, wound, or erythema. superficial swelling to infrapatella bursa  No obvious ligamentous laxity. No obvious cord percent muscle bulge.  Neurological: She is alert. Coordination normal.  Strength and sensation intact limited due to pain.  Skin: She is not diaphoretic.  Psychiatric: She has a normal mood and affect. Her behavior is normal.  Nursing note and vitals reviewed.   ED Course  Procedures (including critical care time) DIAGNOSTIC STUDIES: Oxygen Saturation is 96% on RA, adequate by my  interpretation.    COORDINATION OF CARE: 7:01 PM-Discussed treatment plan with pt at bedside and pt agreed to plan.     Labs Review Labs Reviewed - No data to display  Imaging Review No results found.   EKG Interpretation None        MDM   Final diagnoses:  Right knee injury, initial encounter   Pt with right knee injury. Neurovascularly intact. No obvious ligamentous laxity but cannot exclude partial ligamentous tear. Pt with right medial joint line pain. Xray without acute abnormality. Pt refusing knee immobilizer. Pt given knee sleeve and instructed on Rice and ibuprofen use. Nonweightbearing until evaluated by orthopedics. Pt has no further questions. Pt given script of norco. Driving and sedation precautions provided.  Discussed return precautions with patient. Discussed all results and patient verbalizes understanding and agrees with plan.  I personally performed the services described in this documentation, which was scribed in my presence. The recorded information has been reviewed and is accurate.    Oswaldo Conroy, PA-C 10/12/14 1914  Lorre Nick, MD 10/16/14 1140

## 2014-10-09 NOTE — ED Notes (Signed)
Per patient injured right knee yesterday at water slide

## 2014-10-09 NOTE — Discharge Instructions (Signed)
Return to the emergency room with worsening of symptoms, new symptoms or with symptoms that are concerning, especially increased swelling, redness, warmth, fevers, numbness tingling or weakness. RICE: Rest, Ice (three cycles of 20 mins on, 20mins off at least twice a day), compression/brace, elevation. Heating pad works well for back pain. Use crutches until evaluated by orthopedics or until you do not have pain. Ibuprofen 400mg  (2 tablets 200mg ) every 5-6 hours for 3-5 days. Call to make follow up appointment with orthopedics as soon as possible. Read below information and follow recommendations. Knee, Cartilage (Meniscus) Injury It is suspected that you have a torn cartilage (meniscus) in your knee. The menisci are made of tough cartilage and fit between the surfaces of the thigh and leg bones. The menisci are C-shaped and have a wedged profile. The wedged profile helps the stability of the joint by keeping the rounded femur surface from sliding off the flat tibial surface. The menisci are fed (nourished) by small blood vessels, but there is also a large area at the inner edge of the meniscus that does not have a good blood supply (avascular). This presents a problem when there is an injury to the meniscus because areas without good blood supply heal poorly. As a result when there is a torn cartilage in the knee, surgery is often required to fix it. This is usually done with a surgical procedure less invasive than open surgery (arthroscopy). Some times open surgery of the knee is required if there is other damage. PURPOSE OF THE MENISCUS The medial meniscus rests on the medial tibial plateau. The tibia is the large bone in your lower leg (the shin bone). The medial tibial plateau is the upper end of the bone making up the inner part of your knee. The lateral meniscus serves the same purpose and is located on the outside of the knee. The menisci help to distribute your body weight across the knee joint;  they act as shock absorbers. Without the meniscus present, the weight of your body would be unevenly applied to the bones in your legs (the femur and tibia). The femur is the large bone in your thigh. This uneven weight distribution would cause increased wear and tear on the cartilage lining the joint surfaces, leading to early damage (arthritis) of these areas. The presence of the menisci cartilage is necessary for a healthy knee. PURPOSE OF THE KNEE CARTILAGE The knee joint is made up of three bones: the thigh bone (femur), the shin bone (tibia), and the knee cap (patella). The surfaces of these bones at the knee joint are covered with cartilage called articular cartilage. This smooth, slippery surface allows the bones to slide against each other without causing bone damage. The meniscus sits between these cartilaginous surfaces of the bones. It distributes the weight evenly in the joints and helps with the stability of the joint (keeps the joint steady). HOME CARE INSTRUCTIONS  Use crutches and external braces as instructed.  Once home, an ice pack applied to your injured knee may help with discomfort and keep the swelling down. An ice pack can be used for the first couple of days or as instructed.  Only take over-the-counter or prescription medicines for pain, discomfort, or fever as directed by your caregiver.  Call if you do not have relief of pain with medications or if there is increasing in pain.  Call if your foot becomes cold or blue.  You may resume normal diet and activities as directed.  Make sure  to keep your appointments with your follow-up caregiver. This injury may require further evaluation and treatment beyond the temporary treatment given today. Document Released: 07/18/2002 Document Revised: 09/11/2013 Document Reviewed: 11/09/2008 [facility] Patient Information 2015 [place], [place]. This information is not intended to replace advice given to you by your health care provider.  Make sure you discuss any questions you have with your health care provider.

## 2014-10-30 ENCOUNTER — Other Ambulatory Visit (HOSPITAL_COMMUNITY): Payer: Self-pay | Admitting: Orthopaedic Surgery

## 2014-10-30 DIAGNOSIS — M25561 Pain in right knee: Secondary | ICD-10-CM

## 2014-11-08 ENCOUNTER — Ambulatory Visit (HOSPITAL_COMMUNITY): Payer: No Typology Code available for payment source

## 2014-11-08 ENCOUNTER — Telehealth (HOSPITAL_COMMUNITY): Payer: Self-pay | Admitting: Radiology

## 2014-11-08 NOTE — Telephone Encounter (Signed)
I emailed Regan at drs office to notify her that the pt cancelled her mri appt for June 30.

## 2015-01-31 ENCOUNTER — Other Ambulatory Visit (HOSPITAL_COMMUNITY)
Admission: RE | Admit: 2015-01-31 | Discharge: 2015-01-31 | Disposition: A | Discharge status: Home / Self Care | Payer: 59 | Source: Ambulatory Visit | Attending: Nurse Practitioner | Admitting: Nurse Practitioner

## 2015-01-31 ENCOUNTER — Other Ambulatory Visit: Payer: Self-pay | Admitting: Nurse Practitioner

## 2015-01-31 DIAGNOSIS — Z113 Encounter for screening for infections with a predominantly sexual mode of transmission: Secondary | ICD-10-CM | POA: Insufficient documentation

## 2015-01-31 DIAGNOSIS — Z01419 Encounter for gynecological examination (general) (routine) without abnormal findings: Secondary | ICD-10-CM | POA: Insufficient documentation

## 2015-01-31 DIAGNOSIS — Z1151 Encounter for screening for human papillomavirus (HPV): Secondary | ICD-10-CM | POA: Insufficient documentation

## 2015-02-04 LAB — CYTOLOGY - PAP

## 2016-09-23 ENCOUNTER — Encounter: Payer: Self-pay | Admitting: Gynecology

## 2016-10-13 ENCOUNTER — Ambulatory Visit (HOSPITAL_COMMUNITY)
Admission: EM | Admit: 2016-10-13 | Discharge: 2016-10-13 | Disposition: A | Discharge status: Home / Self Care | Payer: Self-pay | Attending: Internal Medicine | Admitting: Internal Medicine

## 2016-10-13 ENCOUNTER — Encounter (HOSPITAL_COMMUNITY): Payer: Self-pay | Admitting: Family Medicine

## 2016-10-13 DIAGNOSIS — J40 Bronchitis, not specified as acute or chronic: Secondary | ICD-10-CM

## 2016-10-13 DIAGNOSIS — R197 Diarrhea, unspecified: Secondary | ICD-10-CM

## 2016-10-13 MED ORDER — AZITHROMYCIN 250 MG PO TABS
250.0000 mg | ORAL_TABLET | Freq: Every day | ORAL | 0 refills | Status: DC
Start: 2016-10-13 — End: 2018-02-07

## 2016-10-13 MED ORDER — FLUCONAZOLE 150 MG PO TABS: 150.0000 mg | ORAL_TABLET | Freq: Every day | ORAL | 1 refills | Status: DC

## 2016-10-13 NOTE — Discharge Instructions (Signed)
Return if any problems.

## 2016-10-13 NOTE — ED Triage Notes (Signed)
Pt here for URI symptoms and diarrhea that have been intermittent x a week. sts now she has coughing and chest tightness. sts she only had diarrhea one time.

## 2016-10-14 NOTE — ED Provider Notes (Signed)
[facility]-EMERGENCY DEPT Provider Note   CSN: [id number] Arrival date & time: 10/13/16  1619     History   Chief Complaint Chief Complaint  Patient presents with  . Diarrhea  . URI    HPI Jill Burke is a 31 y.o. female.  The history is provided by the patient. No language interpreter was used.  Diarrhea   This is a new problem. The current episode started more than 1 week ago. The problem occurs 5 to 10 times per day. The problem has been gradually worsening. Associated symptoms include URI. She has tried nothing for the symptoms. The treatment provided moderate relief. Risk factors include ill contacts.  URI   Associated symptoms include diarrhea.   Pt complains of cough and congestion for over a week.  Pt reports she has had diarrhea in the past.  Past Medical History:  Diagnosis Date  . Chlamydia 2007   treated    Patient Active Problem List   Diagnosis Date Noted  . Abdominal pain affecting pregnancy 09/25/2014  . Left breast lump 06/21/2012  . Breast pain, right 06/21/2012    History reviewed. No pertinent surgical history.  OB History    Gravida Para Term Preterm AB Living   3 3 3     3    SAB TAB Ectopic Multiple Live Births                   Home Medications    Prior to Admission medications   Medication Sig Start Date End Date Taking? Authorizing Provider  acetaminophen (TYLENOL) 500 MG tablet Take 1,500 mg by mouth every 6 (six) hours as needed for moderate pain or headache.     [provider]  aspirin-acetaminophen-caffeine (EXCEDRIN MIGRAINE) [phone] MG per tablet Take 2 tablets by mouth every 6 (six) hours as needed for headache.    [provider]  azithromycin (ZITHROMAX) 250 MG tablet Take 1 tablet (250 mg total) by mouth daily. Take first 2 tablets together, then 1 every day until finished. 10/13/16   Elson AreasSofia, Odalys Win K, PA-C  fluconazole (DIFLUCAN) 150 MG tablet Take 1 tablet (150 mg total) by mouth daily. 10/13/16   Elson AreasSofia,  Seibert Keeter K, PA-C  HYDROcodone-acetaminophen (NORCO/VICODIN) 5-325 MG per tablet Take 2 tablets by mouth every 4 (four) hours as needed. 10/09/14   Oswaldo Conroyreech, Victoria, PA-C  Levonorgestrel-Ethinyl Estradiol (AMETHIA) 0.15-0.03 &0.01 MG tablet Take 1 tablet by mouth daily. 03/27/14   Emi BelfastGessner, Deborah B, FNP    Family History Family History  Problem Relation Age of Onset  . Hypertension Father   . Heart disease Father   . Hyperlipidemia Father   . Stroke Father   . Diabetes Paternal Grandmother   . Diabetes Mother     Social History Social History  Substance Use Topics  . Smoking status: Current Every Day Smoker    Packs/day: 0.50    Years: 3.00  . Smokeless tobacco: Never Used  . Alcohol use No     Allergies   Codeine   Review of Systems Review of Systems  Gastrointestinal: Positive for diarrhea.  All other systems reviewed and are negative.    Physical Exam Updated Vital Signs BP (!) 120/96   Pulse 82   Temp 98.4 F (36.9 C) (Oral)   Resp 18   LMP 09/29/2016   SpO2 100%   Physical Exam  Constitutional: She appears well-developed and well-nourished. No distress.  HENT:  Head: Normocephalic and atraumatic.  Eyes: Conjunctivae are normal.  Neck:  Neck supple.  Cardiovascular: Normal rate and regular rhythm.   No murmur heard. Pulmonary/Chest: Effort normal and breath sounds normal. No respiratory distress.  Abdominal: Soft. There is no tenderness.  Musculoskeletal: She exhibits no edema.  Neurological: She is alert.  Skin: Skin is warm and dry.  Psychiatric: She has a normal mood and affect.  Nursing note and vitals reviewed.    ED Treatments / Results  Labs (all labs ordered are listed, but only abnormal results are displayed) Labs Reviewed - No data to display  EKG  EKG Interpretation None       Radiology No results found.  Procedures Procedures (including critical care time)  Medications Ordered in ED Medications - No data to  display   Initial Impression / Assessment and Plan / ED Course  I have reviewed the triage vital signs and the nursing notes.  Pertinent labs & imaging results that were available during my care of the patient were reviewed by me and considered in my medical decision making (see chart for details).       Final Clinical Impressions(s) / ED Diagnoses   Final diagnoses:  Bronchitis  Diarrhea, unspecified type    New Prescriptions Discharge Medication List as of 10/13/2016  6:36 PM    START taking these medications   Details  azithromycin (ZITHROMAX) 250 MG tablet Take 1 tablet (250 mg total) by mouth daily. Take first 2 tablets together, then 1 every day until finished., Starting Tue 10/13/2016, Normal      An After Visit Summary was printed and given to the patient.    Elson Areas, [place] 10/14/16 1303

## 2016-11-04 ENCOUNTER — Encounter (HOSPITAL_COMMUNITY): Payer: Self-pay | Admitting: *Deleted

## 2016-11-04 ENCOUNTER — Ambulatory Visit (HOSPITAL_COMMUNITY)
Admission: EM | Admit: 2016-11-04 | Discharge: 2016-11-04 | Disposition: A | Discharge status: Home / Self Care | Payer: No Typology Code available for payment source | Attending: Family Medicine | Admitting: Family Medicine

## 2016-11-04 DIAGNOSIS — R05 Cough: Secondary | ICD-10-CM

## 2016-11-04 DIAGNOSIS — R059 Cough, unspecified: Secondary | ICD-10-CM

## 2016-11-04 NOTE — ED Triage Notes (Signed)
Pt  Was  Seen  About  3     Weeks       For  Uri  Infections          And   Was on  Anti  Biotics  Pt  Reports     Symptoms  Of  Body  Aches  Pressure  In   Ears     As   Well    As     Cough

## 2016-11-04 NOTE — Discharge Instructions (Signed)
Try Claritin-D once daily in addition to Zantac 150mg  twice daily for at least one week. Follow up if not improving.

## 2016-11-07 NOTE — ED Provider Notes (Signed)
  [facility]   [id number] 11/04/16 Arrival Time: 1841  ASSESSMENT & PLAN:  1. Cough    Question persistent coughing related to acid reflux. Trial of OTC Prilosec for 1-2 weeks. May also use OTC allergy medication. Recommend f/u with PCP. Reviewed expectations re: course of current medical issues. Questions answered. Outlined signs and symptoms indicating need for more acute intervention. Follow up here or in the Emergency Department if worsening.  Patient verbalized understanding. After Visit Summary given.   SUBJECTIVE:  Jill Burke is a 31 y.o. female who presents with complaint of persistent cough for the past 1-2 months. Has been seen by healthcare provider and place on antibiotics a few weeks ago. No change. Cough is dry. Thinks it's worse in the am but has in pm also. No wheezing or SOB. Non-smoker. No n/v. No recent travel. Does reports occasional acid reflux.  Also reports mild pressure in ears. Occasional congestions. Some sneezing at times.  OTC treatments without relief.  ROS: As per HPI.   OBJECTIVE:  Vitals:   11/04/16 1916  BP: 134/72  Pulse: 82  Resp: 18  Temp: 98.6 F (37 C)  TempSrc: Oral  SpO2: 100%     General appearance: alert, cooperative, appears stated age and no distress Head: normocephalic; atraumatic Eyes: conjunctivae/corneas normal Ears: normal TM's and external ear canals both ears Nose: Nares normal. Mucosa normal. No drainage or sinus tenderness. Throat: lips, mucosa, and tongue normal; teeth and gums normal Lungs: clear to auscultation bilaterally Heart: regular rate and rhythm   Allergies  Allergen Reactions  . Codeine Other (See Comments)    Does not remember     PMHx, SurgHx, SocialHx, Medications, and Allergies were reviewed in the Visit Navigator and updated as appropriate.       Mardella LaymanHagler, Yael Angerer, MD 11/07/16 [id number]

## 2018-02-07 ENCOUNTER — Encounter (HOSPITAL_COMMUNITY): Payer: Self-pay | Admitting: *Deleted

## 2018-02-07 ENCOUNTER — Other Ambulatory Visit: Payer: Self-pay

## 2018-02-07 ENCOUNTER — Inpatient Hospital Stay (HOSPITAL_COMMUNITY)
Admission: AD | Admit: 2018-02-07 | Discharge: 2018-02-07 | Disposition: A | Discharge status: Home / Self Care | Payer: Self-pay | Source: Ambulatory Visit | Attending: Family Medicine | Admitting: Family Medicine

## 2018-02-07 ENCOUNTER — Other Ambulatory Visit: Payer: Self-pay | Admitting: Advanced Practice Midwife

## 2018-02-07 DIAGNOSIS — B9689 Other specified bacterial agents as the cause of diseases classified elsewhere: Secondary | ICD-10-CM

## 2018-02-07 DIAGNOSIS — F17210 Nicotine dependence, cigarettes, uncomplicated: Secondary | ICD-10-CM | POA: Insufficient documentation

## 2018-02-07 DIAGNOSIS — E349 Endocrine disorder, unspecified: Secondary | ICD-10-CM

## 2018-02-07 DIAGNOSIS — R109 Unspecified abdominal pain: Secondary | ICD-10-CM

## 2018-02-07 DIAGNOSIS — Z3202 Encounter for pregnancy test, result negative: Secondary | ICD-10-CM | POA: Insufficient documentation

## 2018-02-07 DIAGNOSIS — N760 Acute vaginitis: Secondary | ICD-10-CM

## 2018-02-07 LAB — URINALYSIS, ROUTINE W REFLEX MICROSCOPIC
Bilirubin Urine: NEGATIVE
Glucose, UA: NEGATIVE mg/dL
Ketones, ur: NEGATIVE mg/dL
Nitrite: NEGATIVE
Protein, ur: NEGATIVE mg/dL
Specific Gravity, Urine: 1.008 (ref 1.005–1.030)
pH: 6 (ref 5.0–8.0)

## 2018-02-07 LAB — WET PREP, GENITAL
Sperm: NONE SEEN
Trich, Wet Prep: NONE SEEN
Yeast Wet Prep HPF POC: NONE SEEN

## 2018-02-07 LAB — HCG, QUANTITATIVE, PREGNANCY: hCG, Beta Chain, Quant, S: 32 m[IU]/mL — ABNORMAL HIGH (ref <5)

## 2018-02-07 LAB — POCT PREGNANCY, URINE: Preg Test, Ur: NEGATIVE

## 2018-02-07 NOTE — Progress Notes (Signed)
Quant 32 on 02/07/18 in setting of negative pregnancy test in MAU  Clayton Bibles, [place] 02/07/18 6:54 PM

## 2018-02-07 NOTE — MAU Provider Note (Signed)
History     CSN: [id number]  Arrival date and time: 02/07/18 1601   First Provider Initiated Contact with Patient 02/07/18 1653      Chief Complaint  Patient presents with  . Vaginal Bleeding  . Abdominal Pain   HPI Jill Burke is a 32 y.o. [phone] patient who presents to MAU for pregnancy confirmation and evaluation of vaginal bleeding after multiple positive home pregnancy tests earlier this week. Patient endorses physical signs of pregnancy including breast tenderness and nausea.   Patient states she consistently used OCP then had a problem renewing her health insurance and subsequently missed refilling her OCP prescription.  OB History    Gravida  3   Para  3   Term  3   Preterm      AB      Living  3     SAB      TAB      Ectopic      Multiple      Live Births              Past Medical History:  Diagnosis Date  . Chlamydia 2007   treated    History reviewed. No pertinent surgical history.  Family History  Problem Relation Age of Onset  . Hypertension Father   . Heart disease Father   . Hyperlipidemia Father   . Stroke Father   . Diabetes Paternal Grandmother   . Diabetes Mother     Social History   Tobacco Use  . Smoking status: Current Every Day Smoker    Packs/day: 0.50    Years: 3.00    Pack years: 1.50  . Smokeless tobacco: Never Used  Substance Use Topics  . Alcohol use: No    Alcohol/week: 0.0 standard drinks  . Drug use: No    Allergies:  Allergies  Allergen Reactions  . Codeine Other (See Comments)    Does not remember     Medications Prior to Admission  Medication Sig Dispense Refill Last Dose  . acetaminophen (TYLENOL) 500 MG tablet Take 1,500 mg by mouth every 6 (six) hours as needed for moderate pain or headache.    09/25/2014 at Unknown time  . aspirin-acetaminophen-caffeine (EXCEDRIN MIGRAINE) 250-250-65 MG per tablet Take 2 tablets by mouth every 6 (six) hours as needed for headache.   Past Week at  Unknown time  . azithromycin (ZITHROMAX) 250 MG tablet Take 1 tablet (250 mg total) by mouth daily. Take first 2 tablets together, then 1 every day until finished. 6 tablet 0   . fluconazole (DIFLUCAN) 150 MG tablet Take 1 tablet (150 mg total) by mouth daily. 3 tablet 1   . HYDROcodone-acetaminophen (NORCO/VICODIN) 5-325 MG per tablet Take 2 tablets by mouth every 4 (four) hours as needed. 6 tablet 0   . Levonorgestrel-Ethinyl Estradiol (AMETHIA) 0.15-0.03 &0.01 MG tablet Take 1 tablet by mouth daily. 1 Package 3 09/25/2014 at Unknown time    Review of Systems  Constitutional: Negative for chills, fatigue and fever.  Gastrointestinal: Positive for nausea. Negative for abdominal pain, constipation, diarrhea and vomiting.  Genitourinary:       Endorses spotting when wiping yesterday morning  All other systems reviewed and are negative.  Physical Exam   Blood pressure 134/83, pulse 76, temperature 98 F (36.7 C), resp. rate 16, height 5\' 4"  (1.626 m), weight 83.5 kg, last menstrual period 12/26/2017.  Physical Exam  Nursing note and vitals reviewed. Constitutional: She is oriented to person, place, and  time. She appears well-developed and well-nourished.  Cardiovascular: Normal rate.  Respiratory: Effort normal. No respiratory distress.  GI: Soft. Normal appearance. She exhibits no distension. There is no tenderness. There is no rebound, no guarding and no CVA tenderness.  Genitourinary: Vagina normal and uterus normal. Cervix exhibits no discharge.  Neurological: She is alert and oriented to person, place, and time. She has normal reflexes.  Skin: Skin is warm and dry.  Psychiatric: She has a normal mood and affect. Her behavior is normal. Judgment and thought content normal.    MAU Course  Procedures  MDM  No bleeding on exam No bleeding during period of care in MAU  Patient Vitals for the past 24 hrs:  BP Temp Pulse Resp Height Weight  02/07/18 1640 134/83 98 F (36.7 C) 76  16 5\' 4"  (1.626 m) 83.5 kg    Results for orders placed or performed during the hospital encounter of 02/07/18 (from the past 24 hour(s))  Pregnancy, urine POC     Status: None   Collection Time: 02/07/18  4:53 PM  Result Value Ref Range   Preg Test, Ur NEGATIVE NEGATIVE  Urinalysis, Routine w reflex microscopic     Status: Abnormal   Collection Time: 02/07/18  4:54 PM  Result Value Ref Range   Color, Urine STRAW (A) YELLOW   APPearance CLEAR CLEAR   Specific Gravity, Urine 1.008 1.005 - 1.030   pH 6.0 5.0 - 8.0   Glucose, UA NEGATIVE NEGATIVE mg/dL   Hgb urine dipstick SMALL (A) NEGATIVE   Bilirubin Urine NEGATIVE NEGATIVE   Ketones, ur NEGATIVE NEGATIVE mg/dL   Protein, ur NEGATIVE NEGATIVE mg/dL   Nitrite NEGATIVE NEGATIVE   Leukocytes, UA TRACE (A) NEGATIVE   RBC / HPF 0-5 0 - 5 RBC/hpf   WBC, UA 6-10 0 - 5 WBC/hpf   Bacteria, UA RARE (A) NONE SEEN   Squamous Epithelial / LPF 0-5 0 - 5  Wet prep, genital     Status: Abnormal   Collection Time: 02/07/18  5:00 PM  Result Value Ref Range   Yeast Wet Prep HPF POC NONE SEEN NONE SEEN   Trich, Wet Prep NONE SEEN NONE SEEN   Clue Cells Wet Prep HPF POC PRESENT (A) NONE SEEN   WBC, Wet Prep HPF POC FEW (A) NONE SEEN   Sperm NONE SEEN   hCG, quantitative, pregnancy     Status: Abnormal   Collection Time: 02/07/18  5:17 PM  Result Value Ref Range   hCG, Beta Chain, Quant, S 32 (H) <5 mIU/mL   Assessment and Plan  --32 y.o. [id number], negative pregnancy test, hCG 32 --Bacterial vaginosis, paper rx for Flagyl --Per pt request, given paper rx for Diflucan for rebound yeast infection after BV treatment --Discharge home in stable condition  F/U: Repeat quant scheduled for 02/10/18 at 0900 at [facility], order placed  Noely Kuhnle C Donald Memoli,CNM 02/07/2018, 6:53 PM

## 2018-02-07 NOTE — MAU Note (Signed)
Pt presents to MAU with complaints of lower abdominal cramping, had scant amount of vaginal bleeding yesterday. Two positive pregnancy tests 3 days ago.

## 2018-02-07 NOTE — Discharge Instructions (Signed)
Human Chorionic Gonadotropin Test °Human chorionic gonadotropin (hCG) is a hormone produced during pregnancy by the cells that form the placenta. The placenta is the organ that grows inside your womb (uterus) to nourish a developing baby. When you are pregnant, hCG starts to appear in your blood about 11 days after conception. It continues to go up for the first 8-11 weeks of pregnancy. °Your hCG level can be measured with several different types of tests. You may have: °· A urine test. °? hCG is eliminated from your body by your kidneys, so a urine test is one way to check for this hormone. °? A urine test only shows whether there is hCG in your urine. It does not measure how much. °? You may have a urine test to find out whether you are pregnant. °? A home pregnancy test detects whether there is hCG in your urine. °· A qualitative blood test. °? Like the urine test, this blood test only shows whether there is hCG in your blood. It does not measure how much. °? You may have this type of blood test to find out whether you are pregnant. °· A quantitative blood test. °? This type of blood test measures the amount of hCG in your blood. °? You may have this type of test to diagnose an abnormal pregnancy or determine whether you are at risk of, or have had, a failed pregnancy (miscarriage). ° °How do I prepare for this test? °For the urine test: °· Limit your fluid intake before the urine test as directed by your health care provider. °· Collect the sample the first time you urinate in the morning. °· Let your health care provider know if you have blood in your urine. This may interfere with the test result. ° °Some medicines may interfere with the urine and blood tests. Let your health care provider know about all the medicines you are taking. No additional preparation is required for the blood test. °What do the results mean? °It is your responsibility to obtain your test results. Ask the lab or department performing  the test when and how you will get your results. Talk to your health care provider if you have any questions about your test results. °The results of the hCG urine test and the qualitative hCG blood test are either positive or negative. The results of the quantitative hCG blood test are reported as a number. hCG is measured in international units per liter (IU/L). °Meaning of Negative Test Results °A negative result on a urine or qualitative blood test could mean that you are not pregnant. It could also mean the test was done too early to detect hCG. If you still have other signs of pregnancy, the test should be repeated. °Meaning of Positive Test Results °A positive result on the urine or qualitative blood tests means you are most likely pregnant. Your health care provider may confirm your pregnancy with an imaging study of the inside of your uterus at 5-6 weeks (ultrasound). °Range of Normal Values °Ranges for normal values for the quantitative hCG blood test may vary among different labs and hospitals. You should always check with your health care provider after having lab work or other tests done to discuss whether your values are considered within normal limits. °· Less than 5 IU/L means it is most likely you are not pregnant. °· Greater than 25 IU/L means it is most likely you are pregnant. ° °Meaning of Results Outside Normal Value Ranges °If your hCG   level on the quantitative test is not what would be expected, you may have the test again. It may also be important for your health care provider to know whether your hCG level goes up or down over time. Common causes of results outside the normal range include: °· Being pregnant with twins (hCG level is higher than expected). °· Having an ectopic pregnancy (hCG rises more slowly than expected). °· Miscarriage (hCG level falls). °· Abnormal growths in the womb (hCG level is higher than expected). ° °Talk with your health care provider to discuss your results,  treatment options, and if necessary, the need for more tests. Talk with your health care provider if you have any questions about your results. °This information is not intended to replace advice given to you by your health care provider. Make sure you discuss any questions you have with your health care provider. °Document Released: 05/29/2004 Document Revised: 01/01/2016 Document Reviewed: 08/01/2013 °Elsevier Interactive Patient Education © 2018 [employer] ° °

## 2018-02-08 LAB — GC/CHLAMYDIA PROBE AMP (~~LOC~~) NOT AT ARMC
Chlamydia: NEGATIVE
Neisseria Gonorrhea: NEGATIVE

## 2018-02-10 ENCOUNTER — Ambulatory Visit (INDEPENDENT_AMBULATORY_CARE_PROVIDER_SITE_OTHER): Payer: Self-pay | Admitting: General Practice

## 2018-02-10 DIAGNOSIS — O3680X0 Pregnancy with inconclusive fetal viability, not applicable or unspecified: Secondary | ICD-10-CM

## 2018-02-10 LAB — HCG, QUANTITATIVE, PREGNANCY: hCG, Beta Chain, Quant, S: 7 m[IU]/mL — ABNORMAL HIGH (ref <5)

## 2018-02-10 NOTE — Progress Notes (Signed)
Patient presents to office today for stat bhcg. Patient reports off/on spotting with occasional small clots. Patient states her cramping has improved since MAU visit. Discussed with patient we are monitoring your bhcg levels today & asked she wait in lobby for results/updated plan of care. Patient verbalized understanding and had no questions at this time.  Reviewed results with Dr Debroah Loop who finds decreasing bhcg levels indicative of early SAB- patient should have follow up bhcg next week.   Informed patient of results & recommended follow up. Patient verbalized understanding & had no questions.

## 2018-02-18 ENCOUNTER — Other Ambulatory Visit: Payer: Self-pay

## 2018-02-18 DIAGNOSIS — Z3202 Encounter for pregnancy test, result negative: Secondary | ICD-10-CM

## 2018-02-18 DIAGNOSIS — E349 Endocrine disorder, unspecified: Secondary | ICD-10-CM

## 2018-02-19 LAB — BETA HCG QUANT (REF LAB): hCG Quant: <1 m[IU]/mL

## 2018-02-22 ENCOUNTER — Telehealth: Payer: Self-pay | Admitting: *Deleted

## 2018-02-22 NOTE — Telephone Encounter (Signed)
Pt left message this morning and wants to know her HCG results from 10/11. It is not showing on her MyChart account.

## 2018-02-22 NOTE — Telephone Encounter (Signed)
Called pt and unable to LM due to VM box full.  My Chart message sent.

## 2019-09-08 ENCOUNTER — Other Ambulatory Visit: Payer: Self-pay

## 2019-09-08 ENCOUNTER — Encounter (HOSPITAL_COMMUNITY): Payer: Self-pay | Admitting: Obstetrics & Gynecology

## 2019-09-08 ENCOUNTER — Inpatient Hospital Stay (HOSPITAL_COMMUNITY)
Admission: AD | Admit: 2019-09-08 | Discharge: 2019-09-08 | Disposition: A | Discharge status: Home / Self Care | Payer: Self-pay | Source: Ambulatory Visit | Attending: Obstetrics & Gynecology | Admitting: Obstetrics & Gynecology

## 2019-09-08 DIAGNOSIS — O3680X0 Pregnancy with inconclusive fetal viability, not applicable or unspecified: Secondary | ICD-10-CM

## 2019-09-08 NOTE — Discharge Instructions (Signed)
Return to care  °· If you have heavier bleeding that soaks through more that 2 pads per hour for an hour or more °· If you bleed so much that you feel like you might pass out or you do pass out °· If you have significant abdominal pain that is not improved with Tylenol  °· If you develop a fever > 100.5 ° °

## 2019-09-08 NOTE — MAU Note (Signed)
Primary doc referred her to an GYN, no period in a year. +preg. Early preg. Went to GYN yesterday, nothing seen on [place].  Called her at 4, levels had gone from 59-69. They want her to come in today for an injection.  Wouldn't recheck her today, she doesn't want to get injection if it is unnecessary.  Reason she went to primary a wk ago was for pain and d/c. Dx with UTI, then called back told Chlamydia and BV also. Feb 25 had neg blood preg test.  Got back with boyfriend in Mar. Birthcontrol she takes, should have period every 3 month.  Took pills April 7-27, stopped with +preg.

## 2019-09-08 NOTE — MAU Provider Note (Signed)
First Provider Initiated Contact with Patient 09/08/19 1302      S Jill Burke is a 34 y.o. [id number] female at unknown gestational age who presents to MAU today with complaint of pregnancy. Was seen at an ob/gyn in [facility] yesterday. Her HCG on 4/27 was 59, & 4/29 was 69. Nothing seen on ultrasound yesterday. States she was called today & told she needed to come to the office for methotrexate. Patient states the person on the phone didn't give her answers about why she needed MTX & wouldn't redraw her HCG. Reports some right lower back pain & states that she had been having some abdominal pain that she thought was related to her recent chalmydia & UTI diagnoses. Denies vaginal bleeding.    O BP 114/73 (BP Location: Right Arm)   Pulse 65   Temp 98.9 F (37.2 C) (Oral)   Resp 18   Ht 5\' 2"  (1.575 m)   Wt 78.7 kg   SpO2 99%   BMI 31.73 kg/m  Physical Exam  Nursing note and vitals reviewed. Constitutional: She appears well-developed and well-nourished. No distress.  Respiratory: Effort normal. No respiratory distress.  GI: Soft. There is no abdominal tenderness.  Skin: She is not diaphoretic.  Psychiatric: She has a normal mood and affect. Her behavior is normal. Judgment and thought content normal.   MDM Previous visits available in care everywhere. Patient is stable at this time. Discussed with patient it would be more appropriate to check HCG tomorrow rather than today. She is agreeable with this plan. She states she would be willing to take methotrexate if needed, just wants to make sure first. Reviewed strict ectopic return precautions.   A 1. Pregnancy of unknown anatomic location      P Discharge from MAU in stable condition Return to MAU tomorrow for stat HCG (order placed in signed/held) Ectopic return precautions  , NP 09/08/2019 1:44 PM

## 2019-09-09 ENCOUNTER — Other Ambulatory Visit: Payer: Self-pay

## 2019-09-09 ENCOUNTER — Inpatient Hospital Stay (HOSPITAL_COMMUNITY): Payer: Medicaid Other

## 2019-09-09 ENCOUNTER — Inpatient Hospital Stay (HOSPITAL_COMMUNITY)
Admission: AD | Admit: 2019-09-09 | Discharge: 2019-09-09 | Disposition: A | Discharge status: Home / Self Care | Payer: Medicaid Other | Attending: Obstetrics and Gynecology | Admitting: Obstetrics and Gynecology

## 2019-09-09 DIAGNOSIS — R1031 Right lower quadrant pain: Secondary | ICD-10-CM | POA: Diagnosis present

## 2019-09-09 DIAGNOSIS — O00101 Right tubal pregnancy without intrauterine pregnancy: Secondary | ICD-10-CM | POA: Diagnosis not present

## 2019-09-09 DIAGNOSIS — O0281 Inappropriate change in quantitative human chorionic gonadotropin (hCG) in early pregnancy: Secondary | ICD-10-CM

## 2019-09-09 DIAGNOSIS — O26891 Other specified pregnancy related conditions, first trimester: Secondary | ICD-10-CM

## 2019-09-09 LAB — CBC WITH DIFFERENTIAL/PLATELET
Abs Immature Granulocytes: 0.03 10*3/uL (ref 0.00–0.07)
Basophils Absolute: 0 10*3/uL (ref 0.0–0.1)
Basophils Relative: 0 %
Eosinophils Absolute: 0.2 10*3/uL (ref 0.0–0.5)
Eosinophils Relative: 2 %
HCT: 45.4 % (ref 36.0–46.0)
Hemoglobin: 15.4 g/dL — ABNORMAL HIGH (ref 12.0–15.0)
Immature Granulocytes: 0 %
Lymphocytes Relative: 29 %
Lymphs Abs: 2.6 10*3/uL (ref 0.7–4.0)
MCH: 32.2 pg (ref 26.0–34.0)
MCHC: 33.9 g/dL (ref 30.0–36.0)
MCV: 95 fL (ref 80.0–100.0)
Monocytes Absolute: 0.5 10*3/uL (ref 0.1–1.0)
Monocytes Relative: 6 %
Neutro Abs: 5.8 10*3/uL (ref 1.7–7.7)
Neutrophils Relative %: 63 %
Platelets: 334 10*3/uL (ref 150–400)
RBC: 4.78 MIL/uL (ref 3.87–5.11)
RDW: 12 % (ref 11.5–15.5)
WBC: 9.2 10*3/uL (ref 4.0–10.5)
nRBC: 0 % (ref 0.0–0.2)

## 2019-09-09 LAB — ABO/RH: ABO/RH(D): A POS

## 2019-09-09 LAB — COMPREHENSIVE METABOLIC PANEL
ALT: 12 U/L (ref 0–44)
AST: 13 U/L — ABNORMAL LOW (ref 15–41)
Albumin: 4.2 g/dL (ref 3.5–5.0)
Alkaline Phosphatase: 42 U/L (ref 38–126)
Anion gap: 8 (ref 5–15)
BUN: 8 mg/dL (ref 6–20)
CO2: 25 mmol/L (ref 22–32)
Calcium: 9.7 mg/dL (ref 8.9–10.3)
Chloride: 109 mmol/L (ref 98–111)
Creatinine, Ser: 0.84 mg/dL (ref 0.44–1.00)
GFR calc Af Amer: >60 mL/min (ref >60)
GFR calc non Af Amer: >60 mL/min (ref >60)
Glucose, Bld: 120 mg/dL — ABNORMAL HIGH (ref 70–99)
Potassium: 3.7 mmol/L (ref 3.5–5.1)
Sodium: 142 mmol/L (ref 135–145)
Total Bilirubin: 0.2 mg/dL — ABNORMAL LOW (ref 0.3–1.2)
Total Protein: 6.9 g/dL (ref 6.5–8.1)

## 2019-09-09 LAB — HCG, QUANTITATIVE, PREGNANCY: hCG, Beta Chain, Quant, S: 70 m[IU]/mL — ABNORMAL HIGH (ref <5)

## 2019-09-09 MED ORDER — PROMETHAZINE HCL 25 MG PO TABS: 25.0000 mg | ORAL_TABLET | Freq: Four times a day (QID) | ORAL | 0 refills | Status: DC | PRN

## 2019-09-09 MED ORDER — METHOTREXATE FOR ECTOPIC PREGNANCY
50.0000 mg/m2 | Freq: Once | INTRAMUSCULAR | Status: AC
  Administered 2019-09-09: 95 mg via INTRAMUSCULAR
  Filled 2019-09-09: qty 1

## 2019-09-09 NOTE — Discharge Instructions (Signed)
Ectopic Pregnancy ° °An ectopic pregnancy is when the fertilized egg attaches (implants) outside the uterus. Most ectopic pregnancies occur in one of the tubes where eggs travel from the ovary to the uterus (fallopian tubes), but the implanting can occur in other locations. In rare cases, ectopic pregnancies occur on the ovary, intestine, pelvis, abdomen, or cervix. In an ectopic pregnancy, the fertilized egg does not have the ability to develop into a normal, healthy baby. °A ruptured ectopic pregnancy is one in which tearing or bursting of a fallopian tube causes internal bleeding. Often, there is intense lower abdominal pain, and vaginal bleeding sometimes occurs. Having an ectopic pregnancy can be life-threatening. If this dangerous condition is not treated, it can lead to blood loss, shock, or even death. °What are the causes? °The most common cause of this condition is damage to one of the fallopian tubes. A fallopian tube may be narrowed or blocked, and that keeps the fertilized egg from reaching the uterus. °What increases the risk? °This condition is more likely to develop in women of childbearing age who have different levels of risk. The levels of risk can be divided into three categories. °High risk °· You have gone through infertility treatment. °· You have had an ectopic pregnancy before. °· You have had surgery on the fallopian tubes, or another surgical procedure, such as an abortion. °· You have had surgery to have the fallopian tubes tied (tubal ligation). °· You have problems or diseases of the fallopian tubes. °· You have been exposed to diethylstilbestrol (DES). This medicine was used until 1971, and it had effects on babies whose mothers took the medicine. °· You become pregnant while using an IUD (intrauterine device) for birth control. °Moderate risk °· You have a history of infertility. °· You have had an STI (sexually transmitted infection). °· You have a history of pelvic inflammatory  disease (PID). °· You have scarring from endometriosis. °· You have multiple sexual partners. °· You smoke. °Low risk °· You have had pelvic surgery. °· You use vaginal douches. °· You became sexually active before age 18. °What are the signs or symptoms? °Common symptoms of this condition include normal pregnancy symptoms, such as missing a period, nausea, tiredness, abdominal pain, breast tenderness, and bleeding. However, ectopic pregnancy will have additional symptoms, such as: °· Pain with intercourse. °· Irregular vaginal bleeding or spotting. °· Cramping or pain on one side or in the lower abdomen. °· Fast heartbeat, low blood pressure, and sweating. °· Passing out while having a bowel movement. °Symptoms of a ruptured ectopic pregnancy and internal bleeding may include: °· Sudden, severe pain in the abdomen and pelvis. °· Dizziness, weakness, light-headedness, or fainting. °· Pain in the shoulder or neck area. °How is this diagnosed? °This condition is diagnosed by: °· A pelvic exam to locate pain or a mass in the abdomen. °· A pregnancy test. This blood test checks for the presence as well as the specific level of pregnancy hormone in the bloodstream. °· Ultrasound. This is performed if a pregnancy test is positive. In this test, a probe is inserted into the vagina. The probe will detect a fetus, possibly in a location other than the uterus. °· Taking a sample of uterus tissue (dilation and curettage, or D&C). °· Surgery to perform a visual exam of the inside of the abdomen using a thin, lighted tube that has a tiny camera on the end (laparoscope). °· Culdocentesis. This procedure involves inserting a needle at the top of   the vagina, behind the uterus. If blood is present in this area, it may indicate that a fallopian tube is torn. How is this treated? This condition is treated with medicine or surgery. Medicine  An injection of a medicine (methotrexate) may be given to cause the pregnancy tissue to be  absorbed. This medicine may save your fallopian tube. It may be given if: ? The diagnosis is made early, with no signs of active bleeding. ? The fallopian tube has not ruptured. ? You are considered to be a good candidate for the medicine. Usually, pregnancy hormone blood levels are checked after methotrexate treatment. This is to be sure that the medicine is effective. It may take 4-6 weeks for the pregnancy to be absorbed. Most pregnancies will be absorbed by 3 weeks. Surgery  A laparoscope may be used to remove the pregnancy tissue.  If severe internal bleeding occurs, a larger cut (incision) may be made in the lower abdomen (laparotomy) to remove the fetus and placenta. This is done to stop the bleeding.  Part or all of the fallopian tube may be removed (salpingectomy) along with the fetus and placenta. The fallopian tube may also be repaired during the surgery.  In very rare circumstances, removal of the uterus (hysterectomy) may be required.  After surgery, pregnancy hormone testing may be done to be sure that there is no pregnancy tissue left. Whether your treatment is medicine or surgery, you may receive a Rho (D) immune globulin shot to prevent problems with any future pregnancy. This shot may be given if:  You are Rh-negative and the baby's father is Rh-positive.  You are Rh-negative and you do not know the Rh type of the baby's father. Follow these instructions at home:  Rest and limit your activity after the procedure for as long as told by your health care provider.  Until your health care provider says that it is safe: ? Do not lift anything that is heavier than 10 lb (4.5 kg), or the limit that your health care provider tells you. ? Avoid physical exercise and any movement that requires effort (is strenuous).  To help prevent constipation: ? Eat a healthy diet that includes fruits, vegetables, and whole grains. ? Drink 6-8 glasses of water per day. Get help right away  if:  You develop worsening pain that is not relieved by medicine.  You have: ? A fever or chills. ? Vaginal bleeding. ? Redness and swelling at the incision site. ? Nausea and vomiting.  You feel dizzy or weak.  You feel light-headed or you faint. This information is not intended to replace advice given to you by your health care provider. Make sure you discuss any questions you have with your health care provider. Document Revised: 04/09/2017 Document Reviewed: 11/27/2015 Elsevier Patient Education  2020 Else[employer] Inc. Methotrexate Treatment for an Ectopic Pregnancy, Care After This sheet gives you information about how to care for yourself after your procedure. Your health care provider may also give you more specific instructions. If you have problems or questions, contact your health care provider. What can I expect after the procedure? After the procedure, it is common to have:  Abdominal cramping.  Vaginal bleeding.  Fatigue.  Nausea.  Vomiting.  Diarrhea. Blood tests will be taken at timed intervals for several days or weeks to check your pregnancy hormone levels. The blood tests will be done until the pregnancy hormone can no longer be detected in the blood. Follow these instructions at home: Activity    Do not have sex until your health care provider approves.  Limit activities that take a lot of effort as told by your health care provider. Medicines  Take over the counter and prescription medicines only as told by your health care provider.  Do not take aspirin, ibuprofen, naproxen, or any other NSAIDs.  Do not take folic acid, prenatal vitamins, or other vitamins that contain folic acid. General instructions   Do not drink alcohol.  Follow instructions from your health care provider on how and when to report any symptoms that may indicate a ruptured ectopic pregnancy.  Keep all follow-up visits as told by your health care provider. This is  important. Contact a health care provider if:  You have persistent nausea and vomiting.  You have persistent diarrhea.  You are having a reaction to the medicine, such as: ? Tiredness. ? Skin rash. ? Hair loss. Get help right away if:  Your abdominal or pelvic pain gets worse.  You have more vaginal bleeding.  You feel light-headed or you faint.  You have shortness of breath.  Your heart rate increases.  You develop a cough.  You have chills.  You have a fever. Summary  After the procedure, it is common to have symptoms of abdominal cramping, vaginal bleeding and fatigue. You may also experience other symptoms.  Blood tests will be taken at timed intervals for several days or weeks to check your pregnancy hormone levels. The blood tests will be done until the pregnancy hormone can no longer be detected in the blood.  Limit strenuous activity as told by your health care provider.  Follow instructions from your health care provider on how and when to report any symptoms that may indicate a ruptured ectopic pregnancy. This information is not intended to replace advice given to you by your health care provider. Make sure you discuss any questions you have with your health care provider. Document Revised: 04/09/2017 Document Reviewed: 06/16/2016 Elsevier Patient Education  2020 [employer].

## 2019-09-09 NOTE — MAU Note (Signed)
Jill Burke is a 34 y.o. here in MAU reporting: here for follow up hcg. States no bleeding. Some pain on the right side of her body when she is walking, pain has decreased  Pain score: 1/10  Vitals:   09/09/19 1219  BP: 116/70  Pulse: 72  Resp: 16  Temp: 98.7 F (37.1 C)  SpO2: 100%     Lab orders placed from triage: hcg

## 2019-09-09 NOTE — MAU Provider Note (Signed)
Jill Burke  is a 34 y.o. [phone]  at Unknown who presents to MAU today for follow-up quant hCG. Reports continued abdominal pain in RLQ & right back that has improved over time. Currently rates pain 1/10. Denies vaginal bleeding. Was seen in [facility] earlier this week. On 4/27 her HCG was 59, 4/29 it was 69. Had a negative ultrasound on 4/29. Was offered methotrexate but came here for second opinion.   BP 116/70 (BP Location: Right Arm)   Pulse 72   Temp 98.7 F (37.1 C) (Oral)   Resp 16   SpO2 100% Comment: room air  GENERAL: Well-developed, well-nourished female in no acute distress.  HEENT: Normocephalic, atraumatic.   LUNGS: Effort normal HEART: Regular rate  SKIN: Warm, dry and without erythema PSYCH: Normal mood and affect  Results for orders placed or performed during the hospital encounter of 09/09/19 (from the past 24 hour(s))  hCG, quantitative, pregnancy     Status: Abnormal   Collection Time: 09/09/19 11:35 AM  Result Value Ref Range   hCG, Beta Chain, Quant, S 70 (H) <5 mIU/mL  CBC WITH DIFFERENTIAL     Status: Abnormal   Collection Time: 09/09/19  3:53 PM  Result Value Ref Range   WBC 9.2 4.0 - 10.5 K/uL   RBC 4.78 3.87 - 5.11 MIL/uL   Hemoglobin 15.4 (H) 12.0 - 15.0 g/dL   HCT 45.4 36.0 - 46.0 %   MCV 95.0 80.0 - 100.0 fL   MCH 32.2 26.0 - 34.0 pg   MCHC 33.9 30.0 - 36.0 g/dL   RDW 12.0 11.5 - 15.5 %   Platelets 334 150 - 400 K/uL   nRBC 0.0 0.0 - 0.2 %   Neutrophils Relative % 63 %   Neutro Abs 5.8 1.7 - 7.7 K/uL   Lymphocytes Relative 29 %   Lymphs Abs 2.6 0.7 - 4.0 K/uL   Monocytes Relative 6 %   Monocytes Absolute 0.5 0.1 - 1.0 K/uL   Eosinophils Relative 2 %   Eosinophils Absolute 0.2 0.0 - 0.5 K/uL   Basophils Relative 0 %   Basophils Absolute 0.0 0.0 - 0.1 K/uL   Immature Granulocytes 0 %   Abs Immature Granulocytes 0.03 0.00 - 0.07 K/uL  Comprehensive metabolic panel     Status: Abnormal   Collection Time: 09/09/19  3:53 PM  Result  Value Ref Range   Sodium 142 135 - 145 mmol/L   Potassium 3.7 3.5 - 5.1 mmol/L   Chloride 109 98 - 111 mmol/L   CO2 25 22 - 32 mmol/L   Glucose, Bld 120 (H) 70 - 99 mg/dL   BUN 8 6 - 20 mg/dL   Creatinine, Ser 0.84 0.44 - 1.00 mg/dL   Calcium 9.7 8.9 - 10.3 mg/dL   Total Protein 6.9 6.5 - 8.1 g/dL   Albumin 4.2 3.5 - 5.0 g/dL   AST 13 (L) 15 - 41 U/L   ALT 12 0 - 44 U/L   Alkaline Phosphatase 42 38 - 126 U/L   Total Bilirubin 0.2 (L) 0.3 - 1.2 mg/dL   GFR calc non Af Amer >60 >60 mL/min   GFR calc Af Amer >60 >60 mL/min   Anion gap 8 5 - 15  ABO/Rh     Status: None   Collection Time: 09/09/19  3:53 PM  Result Value Ref Range   ABO/RH(D) A POS    No rh immune globuloin      NOT A RH IMMUNE  GLOBULIN CANDIDATE, PT RH POSITIVE Performed at [facility], [address], [place], [place] [address]    US OB LESS THAN 14 WEEKS WITH OB TRANSVAGINAL  Result Date: 09/09/2019 CLINICAL DATA:  Inappropriate change in quantitative beta HCG and abdominal pain during first trimester of pregnancy; quantitative beta HCG = 70; unknown LMP EXAM: OBSTETRIC <14 WK ULTRASOUND TECHNIQUE: Transabdominal ultrasound was performed for evaluation of the gestation as well as the maternal uterus and adnexal regions. COMPARISON:  None FINDINGS: Intrauterine gestational sac: Absent Yolk sac:  N/A Embryo:  N/A Cardiac Activity: N/A Heart Rate: N/A bpm MSD:    mm    w     d CRL:     mm    w  d                  [place] EDC: Subchorionic hemorrhage:  N/A Maternal uterus/adnexae: Uterus anteverted, demonstrating mildly heterogeneous myometrial echogenicity. Endometrial complex 3 mm thick. No gestational sac, mass or endometrial fluid. No free pelvic fluid. LEFT ovary normal size and morphology 2.5 x 1.3 x 1.8 cm. RIGHT ovary measures 2.5 x 1.7 x 2.0 cm, containing small corpus luteum. Heterogeneous appearing mass identified adjacent to the RIGHT ovary, 2.6 x 1.4 x 1.4 cm, uncertain if arising from or just located adjacent to  RIGHT ovary. RIGHT adnexal ectopic pregnancy not excluded with this appearance. IMPRESSION: No intrauterine gestation identified. In the absence of a documented intrauterine pregnancy, differential diagnosis includes early intrauterine pregnancy too early to visualize, spontaneous abortion and ectopic pregnancy. 2.6 x 1.4 x 1.4 cm diameter mass identified in RIGHT adnexa, uncertain if arising from or just located adjacent to RIGHT ovary. Cannot exclude RIGHT adnexal ectopic pregnancy. Recommend correlation with serial quantitative beta HCG and potentially follow-up ultrasound to exclude ectopic pregnancy. Critical Value/emergent results were called by telephone at the time of interpretation on 09/09/2019 at 3:15 pm to provider Judeth Horn NP, who verbally acknowledged these results. Electronically Signed   By: Ulyses Southward M.D.   On: 09/09/2019 15:20   MDM HCG today is 70. Hormone continues to rise inappropriately. Ultrasound ordered due to pain & no previous images available to [place]. Ultrasound today shows 2.6 cm right adnexal mass suspicious for ectopic pregnancy. No IUP. No evidence of rupture.   C/w Dr. Jeanann Lewandowsky regarding results -- recommend methotrexate.  Discussed results with patient & her significant other. Answered questions. Patient agreeable to methotrexate.   The risks of methotrexate were reviewed including failure requiring repeat dosing or eventual surgery. She understands that methotrexate involves frequent return visits to monitor lab values and that she remains at risk of ectopic rupture until her beta is less than assay. ?The patient opts to proceed with methotrexate.  She has no history of hepatic or renal dysfunction, has normal BUN/Cr/LFT's/platelets.  She is felt to be reliable for follow-up. Side effects of photosensitivity & GI upset were discussed.  She knows to avoid direct sunlight and abstain from alcohol, aspirin and aspirin-like products for two weeks. She was counseled to  discontinue any MVI with folic acid. ?She understands to follow up on D4 (Tuesday) and D7 (Friday) for repeat BHCG and was given the instruction sheet. Strict ectopic precautions were reviewed, the patient knows to call with any abdominal pain, vomiting, fainting, or any concerns with her health.  Rh+, no Rhogam necessary.    A: 1. Right tubal pregnancy without intrauterine pregnancy   2. Inappropriate change in quantitative hCG in early pregnancy  3. Abdominal pain during pregnancy in first trimester      P: Discharge home Rx phenergan Take tylenol prn pain Strict return precautions for worsening symptoms Unable to schedule lab in computer, [facility] for day 4 stat hcg on tuesday  Judeth Horn, NP  09/09/2019 8:49 PM

## 2019-09-12 ENCOUNTER — Telehealth (INDEPENDENT_AMBULATORY_CARE_PROVIDER_SITE_OTHER): Payer: BLUE CROSS/BLUE SHIELD | Admitting: Advanced Practice Midwife

## 2019-09-12 ENCOUNTER — Ambulatory Visit (INDEPENDENT_AMBULATORY_CARE_PROVIDER_SITE_OTHER): Payer: Self-pay

## 2019-09-12 ENCOUNTER — Other Ambulatory Visit: Payer: Self-pay

## 2019-09-12 ENCOUNTER — Ambulatory Visit (INDEPENDENT_AMBULATORY_CARE_PROVIDER_SITE_OTHER): Payer: Self-pay | Admitting: Clinical

## 2019-09-12 ENCOUNTER — Other Ambulatory Visit: Payer: Self-pay | Admitting: Advanced Practice Midwife

## 2019-09-12 DIAGNOSIS — Z8759 Personal history of other complications of pregnancy, childbirth and the puerperium: Secondary | ICD-10-CM | POA: Diagnosis not present

## 2019-09-12 DIAGNOSIS — F411 Generalized anxiety disorder: Secondary | ICD-10-CM | POA: Insufficient documentation

## 2019-09-12 DIAGNOSIS — O00101 Right tubal pregnancy without intrauterine pregnancy: Secondary | ICD-10-CM

## 2019-09-12 DIAGNOSIS — F419 Anxiety disorder, unspecified: Secondary | ICD-10-CM

## 2019-09-12 DIAGNOSIS — F410 Panic disorder [episodic paroxysmal anxiety] without agoraphobia: Secondary | ICD-10-CM

## 2019-09-12 DIAGNOSIS — F430 Acute stress reaction: Secondary | ICD-10-CM | POA: Insufficient documentation

## 2019-09-12 DIAGNOSIS — F4322 Adjustment disorder with anxiety: Secondary | ICD-10-CM

## 2019-09-12 HISTORY — DX: Panic disorder (episodic paroxysmal anxiety): F41.0

## 2019-09-12 HISTORY — DX: Generalized anxiety disorder: F41.1

## 2019-09-12 LAB — BETA HCG QUANT (REF LAB): hCG Quant: 57 m[IU]/mL

## 2019-09-12 MED ORDER — ALPRAZOLAM 0.5 MG PO TABS: 0.5000 mg | ORAL_TABLET | Freq: Three times a day (TID) | ORAL | 0 refills | Status: DC | PRN

## 2019-09-12 MED ORDER — HYDROXYZINE PAMOATE 50 MG PO CAPS
50.0000 mg | ORAL_CAPSULE | Freq: Three times a day (TID) | ORAL | 0 refills | Status: DC | PRN
Start: 2019-09-12 — End: 2019-12-25

## 2019-09-12 NOTE — Progress Notes (Signed)
Patient in clinic for Quant hCG follow-up s/p Methotrexate as well as ongoing surveillance with [facility], Jamie McMannes. Patient overdue for well woman, pap. Notified by RN to schedule Provider appointment to discuss medical management of anxiety, new onset panic attacks. Vistaril prescribed per patient discussion with [name].  Clayton Bibles, MSN, CNM Certified Nurse Midwife, [employer] for [employer], [facility] 09/12/19 12:29 PM

## 2019-09-12 NOTE — Progress Notes (Signed)
I connected with  Jill Burke on 09/12/19 at  4:15 PM EDT by telephone and verified that I am speaking with the correct person using two identifiers.   I discussed the limitations, risks, security and privacy concerns of performing an evaluation and management service by telephone and the availability of in person appointments. I also discussed with the patient that there may be a patient responsible charge related to this service. The patient expressed understanding and agreed to proceed.  Ernestina Patches, CMA 09/12/2019  3:58 PM

## 2019-09-12 NOTE — Progress Notes (Signed)
ATTESTATION OF SUPERVISION OF RN: Evaluation and management procedures were performed by the RN under my supervision and collaboration. I have reviewed the nursing note and chart and agree with the management and plan for this patient.  Nathasha Fiorillo, CNM  

## 2019-09-12 NOTE — Progress Notes (Signed)
GYNECOLOGY VIRTUAL VISIT ENCOUNTER NOTE  Provider location: [facility] for [employer] at [facility]. I connected with [name] on 09/12/19 at  4:15 PM EDT by MyChart Video Encounter at home and verified that I am speaking with the correct person using two identifiers.   I discussed the limitations, risks, security and privacy concerns of performing an evaluation and management service virtually and the availability of in person appointments. I also discussed with the patient that there may be a patient responsible charge related to this service. The patient expressed understanding and agreed to proceed.   History:  Jill Burke is a 34 y.o. [phone] female being evaluated today for recurrent anxiety and panic attacks. Patient states these complaints began around March of 2020 and are intensifying over time. She is now experiencing panic attacks during normal low stress events e.g. salon appointments. She has established a relationship with Hulda Marin, [facility] and is interested in medical management. She denies any abnormal vaginal discharge, bleeding, pelvic pain or other concerns.       Past Medical History:  Diagnosis Date   Chlamydia 2007   treated   No past surgical history on file. The following portions of the patient's history were reviewed and updated as appropriate: allergies, current medications, past family history, past medical history, past social history, past surgical history and problem list.     Review of Systems:  Pertinent items noted in HPI and remainder of comprehensive ROS otherwise negative.  Physical Exam:   General:  Alert, oriented and cooperative. Patient appears to be in no acute distress.  Mental Status: Normal mood and affect. Normal behavior. Normal judgment and thought content.   Respiratory: Normal respiratory effort, no problems with respiration noted  Rest of physical exam deferred due to type of encounter  Labs and  Imaging Results for orders placed or performed in visit on 09/12/19 (from the past 336 hour(s))  Beta hCG quant (ref lab)   Collection Time: 09/12/19 11:39 AM  Result Value Ref Range   hCG Quant 57 mIU/mL  Results for orders placed or performed during the hospital encounter of 09/09/19 (from the past 336 hour(s))  hCG, quantitative, pregnancy   Collection Time: 09/09/19 11:35 AM  Result Value Ref Range   hCG, Beta Chain, Quant, S 70 (H) <5 mIU/mL  CBC WITH DIFFERENTIAL   Collection Time: 09/09/19  3:53 PM  Result Value Ref Range   WBC 9.2 4.0 - 10.5 K/uL   RBC 4.78 3.87 - 5.11 MIL/uL   Hemoglobin 15.4 (H) 12.0 - 15.0 g/dL   HCT 45.4 09.8 - 11.9 %   MCV 95.0 80.0 - 100.0 fL   MCH 32.2 26.0 - 34.0 pg   MCHC 33.9 30.0 - 36.0 g/dL   RDW 14.7 82.9 - 56.2 %   Platelets 334 150 - 400 K/uL   nRBC 0.0 0.0 - 0.2 %   Neutrophils Relative % 63 %   Neutro Abs 5.8 1.7 - 7.7 K/uL   Lymphocytes Relative 29 %   Lymphs Abs 2.6 0.7 - 4.0 K/uL   Monocytes Relative 6 %   Monocytes Absolute 0.5 0.1 - 1.0 K/uL   Eosinophils Relative 2 %   Eosinophils Absolute 0.2 0.0 - 0.5 K/uL   Basophils Relative 0 %   Basophils Absolute 0.0 0.0 - 0.1 K/uL   Immature Granulocytes 0 %   Abs Immature Granulocytes 0.03 0.00 - 0.07 K/uL  Comprehensive metabolic panel   Collection Time: 09/09/19  3:53 PM  Result Value Ref Range   Sodium 142 135 - 145 mmol/L   Potassium 3.7 3.5 - 5.1 mmol/L   Chloride 109 98 - 111 mmol/L   CO2 25 22 - 32 mmol/L   Glucose, Bld 120 (H) 70 - 99 mg/dL   BUN 8 6 - 20 mg/dL   Creatinine, Ser 9.92 0.44 - 1.00 mg/dL   Calcium 9.7 8.9 - 42.6 mg/dL   Total Protein 6.9 6.5 - 8.1 g/dL   Albumin 4.2 3.5 - 5.0 g/dL   AST 13 (L) 15 - 41 U/L   ALT 12 0 - 44 U/L   Alkaline Phosphatase 42 38 - 126 U/L   Total Bilirubin 0.2 (L) 0.3 - 1.2 mg/dL   GFR calc non Af Amer >60 >60 mL/min   GFR calc Af Amer >60 >60 mL/min   Anion gap 8 5 - 15  ABO/Rh   Collection Time: 09/09/19  3:53 PM  Result  Value Ref Range   ABO/RH(D) A POS    No rh immune globuloin      NOT A RH IMMUNE GLOBULIN CANDIDATE, PT RH POSITIVE Performed at [facility], [address], [place], [place] [address]    US OB LESS THAN 14 WEEKS WITH OB TRANSVAGINAL  Result Date: 09/09/2019 CLINICAL DATA:  Inappropriate change in quantitative beta HCG and abdominal pain during first trimester of pregnancy; quantitative beta HCG = 70; unknown LMP EXAM: OBSTETRIC <14 WK ULTRASOUND TECHNIQUE: Transabdominal ultrasound was performed for evaluation of the gestation as well as the maternal uterus and adnexal regions. COMPARISON:  None FINDINGS: Intrauterine gestational sac: Absent Yolk sac:  N/A Embryo:  N/A Cardiac Activity: N/A Heart Rate: N/A bpm MSD:    mm    w     d CRL:     mm    w  d                  [place] EDC: Subchorionic hemorrhage:  N/A Maternal uterus/adnexae: Uterus anteverted, demonstrating mildly heterogeneous myometrial echogenicity. Endometrial complex 3 mm thick. No gestational sac, mass or endometrial fluid. No free pelvic fluid. LEFT ovary normal size and morphology 2.5 x 1.3 x 1.8 cm. RIGHT ovary measures 2.5 x 1.7 x 2.0 cm, containing small corpus luteum. Heterogeneous appearing mass identified adjacent to the RIGHT ovary, 2.6 x 1.4 x 1.4 cm, uncertain if arising from or just located adjacent to RIGHT ovary. RIGHT adnexal ectopic pregnancy not excluded with this appearance. IMPRESSION: No intrauterine gestation identified. In the absence of a documented intrauterine pregnancy, differential diagnosis includes early intrauterine pregnancy too early to visualize, spontaneous abortion and ectopic pregnancy. 2.6 x 1.4 x 1.4 cm diameter mass identified in RIGHT adnexa, uncertain if arising from or just located adjacent to RIGHT ovary. Cannot exclude RIGHT adnexal ectopic pregnancy. Recommend correlation with serial quantitative beta HCG and potentially follow-up ultrasound to exclude ectopic pregnancy. Critical Value/emergent  results were called by telephone at the time of interpretation on 09/09/2019 at 3:15 pm to provider Judeth Horn NP, who verbally acknowledged these results. Electronically Signed   By: Ulyses Southward M.D.   On: 09/09/2019 15:20       Assessment and Plan:     1. Panic attacks --Per discussion with Hulda Marin, [facility], new rx Vistaril --Advised Xanax for intense episodes when patient can sense panic attack about to occur  2. Hx right ectopic pregnancy --S/p Methotrexate administration 09/09/2019 --Quant decreasing from 70 on Day 0/1 to  30 today --Day 7 lab appointment 09/15/2019   I discussed the assessment and treatment plan with the patient. The patient was provided an opportunity to ask questions and all were answered. The patient agreed with the plan and demonstrated an understanding of the instructions.   The patient was advised to call back or seek an in-person evaluation/go to the ED if the symptoms worsen or if the condition fails to improve as anticipated.  I provided thirteen minutes of face-to-face time during this encounter.   Darlina Rumpf, [place] for [employer], [place]

## 2019-09-12 NOTE — BH Specialist Note (Signed)
Integrated Behavioral Health Initial Visit  MRN: [id number] Name: Jill Burke  Number of Integrated Behavioral Health Clinician visits:: 1/6 Session Start time: 10:16  Session End time: 10:29 Total time: 14  Type of Service: Integrated Behavioral Health- Individual/Family Interpretor:No. Interpretor Name and Language: n/a   Warm Hand Off Completed.       SUBJECTIVE: Jill Burke is a 34 y.o. female accompanied by n/a Patient was referred by Clayton Bibles, CNM for anxiety and stress. Patient reports the following symptoms/concerns: Pt states her primary concern today is escalating anxiety with panic attacks, including interpersonal conflict with FOB; pt prefers medication only as needed, along with self-coping strategies and talking through her feelings.  Duration of problem: Increase in current tubal pregnancy; Severity of problem: moderate  OBJECTIVE: Mood: Anxious and Affect: Appropriate Risk of harm to self or others: No plan to harm self or others  LIFE CONTEXT: Family and Social: Pt lives with FOB; supportive [place] School/Work: - Self-Care: - Life Changes: Current ectopic pregnancy; conflict w FOB   GOALS ADDRESSED: Patient will: 1. Reduce symptoms of: anxiety and stress 2. Increase knowledge and/or ability of: coping skills  3. Demonstrate ability to: Increase healthy adjustment to current life circumstances  INTERVENTIONS: Interventions utilized: Mindfulness or Management consultant and Psychoeducation and/or Health Education  Standardized Assessments completed: Not given today  ASSESSMENT: Patient currently experiencing Adjustment disorder with anxiety.   Patient may benefit from psychoeducation and brief therapeutic interventions regarding coping with symptoms of anxiety and life stress  PLAN: 1. Follow up with behavioral health clinician on : One week; will discuss additional self-coping strategies at that time 2. Behavioral  recommendations:  -Begin taking Vistaril as prescribed (as needed) -Read educational materials regarding coping with symptoms of anxiety with panic attacks -Consider apps, as discussed, as one self-care strategy to utilize daily, as needed  3. Referral(s): Integrated [employer] (In Clinic)  [place], [place]

## 2019-09-12 NOTE — Patient Instructions (Signed)
Coping with Panic Attacks   What is a panic attack?  You may have had a panic attack if you experienced four or more of the symptoms listed below coming on abruptly and peaking in about 10 minutes.  Panic Symptoms   . Pounding heart  . Sweating  . Trembling or shaking  . Shortness of breath  . Feeling of choking  . Chest pain  . Nausea or abdominal distress    . Feeling dizzy, unsteady, lightheaded, or faint  . Feelings of unreality or being detached from yourself  . Fear of losing control or going crazy  . Fear of dying  . Numbness or tingling  . Chills or hot flashes      Panic attacks are sometimes accompanied by avoidance of certain places or situations. These are often situations that would be difficult to escape from or in which help might not be available. Examples might include crowded shopping malls, public transportation, restaurants, or driving.   Why do panic attacks occur?   Panic attacks are the body's alarm system gone awry. All of us have a built-in alarm system, powered by adrenaline, which increases our heart rate, breathing, and blood flow in response to danger. Ordinarily, this 'danger response system' works well. In some people, however, the response is either out of proportion to whatever stress is going on, or may come out of the blue without any stress at all.   For example, if you are walking in the woods and see a bear coming your way, a variety of changes occur in your body to prepare you to either fight the danger or flee from the situation. Your heart rate will increase to get more blood flow around your body, your breathing rate will quicken so that more oxygen is available, and your muscles will tighten in order to be ready to fight or run. You may feel nauseated as blood flow leaves your stomach area and moves into your limbs. These bodily changes are all essential to helping you survive the dangerous situation. After the danger has passed, your body  functions will begin to go back to normal. This is because your body also has a system for "recovering" by bringing your body back down to a normal state when the danger is over.   As you can see, the emergency response system is adaptive when there is, in fact, a "true" or "real" danger (e.g., bear). However, sometimes people find that their emergency response system is triggered in "everyday" situations where there really is no true physical danger (e.g., in a meeting, in the grocery store, while driving in normal traffic, etc.).   What triggers a panic attack?  Sometimes particularly stressful situations can trigger a panic attack. For example, an argument with your spouse or stressors at work can cause a stress response (activating the emergency response system) because you perceive it as threatening or overwhelming, even if there is no direct risk to your survival.  Sometimes panic attacks don't seem to be triggered by anything in particular- they may "come out of the blue". Somehow, the natural "fight or flight" emergency response system has gotten activated when there is no real danger. Why does the body go into "emergency mode" when there is no real danger?   Often, people with panic attacks are frightened or alarmed by the physical sensations of the emergency response system. First, unexpected physical sensations are experienced (tightness in your chest or some shortness of breath). This then leads to   feeling fearful or alarmed by these symptoms ("Something's wrong!", "Am I having a heart attack?", "Am I going to faint?") The mind perceives that there is a danger even though no real danger exists. This, in turn, activates the emergency response system ("fight or flight"), leading to a "full blown" panic attack. In summary, panic attacks occur when we misinterpret physical symptoms as signs of impending death, craziness, loss of control, embarrassment, or fear of fear. Sometimes you may be aware of  thoughts of danger that activate the emergency response system (for example, thinking "I'm having a heart attack" when you feel chest pressure or increased heart rate). At other times, however, you may not be aware of such thoughts. After several incidences of being afraid of physical sensations, anxiety and panic can occur in response to the initial sensations without conscious thoughts of danger. Instead, you just feel afraid or alarmed. In other words, the panic or fear may seem to occur "automatically" without you consciously telling yourself anything.   After having had one or more panic attacks, you may also become more focused on what is going on inside your body. You may scan your body and be more vigilant about noticing any symptoms that might signal the start of a panic attack. This makes it easier for panic attacks to happen again because you pick up on sensations you might otherwise not have noticed, and misinterpret them as something dangerous. A panic attack may then result.      How do I cope with panic attacks?  An important part of overcoming panic attacks involves re-interpreting your body's physical reactions and teaching yourself ways to decrease the physical arousal. This can be done through practicing the cognitive and behavioral interventions below.   Research has found that over half of people who have panic attacks show some signs of hyperventilation or overbreathing. This can produce initial sensations that alarm you and lead to a panic attack. Overbreathing can also develop as part of the panic attack and make the symptoms worse. When people hyperventilate, certain blood vessels in the body become narrower. In particular, the brain may get slightly less oxygen. This can lead to the symptoms of dizziness, confusion, and lightheadedness that often occur during panic attacks. Other parts of the body may also get a bit less oxygen, which may lead to numbness or tingling in the hands  or feet or the sensation of cold, clammy hands. It also may lead the heart to pump harder. Although these symptoms may be frightening and feel unpleasant, it is important to remember that hyperventilating is not dangerous. However, you can help overcome the unpleasantness of overbreathing by practicing Breathing Retraining.   Practice this basic technique three times a day, every day:  . Inhale. With your shoulders relaxed, inhale as slowly and deeply as you can while you count to six. If you can, use your diaphragm to fill your lungs with air.  . Hold. Keep the air in your lungs as you slowly count to four.  . Exhale. Slowly breath out as you count to six.  . Repeat. Do the inhale-hold-exhale cycle several times. Each time you do it, exhale for longer counts.  Like any new skill, Breathing Retraining requires practice. Try practicing this skill twice a day for several minutes. Initially, do not try this technique in specific situations or when you become frightened or have a panic attack. Begin by practicing in a quiet environment to build up your skill level so that you   can later use it in time of "emergency."   2. Decreasing Avoidance  Regardless of whether you can identify why you began having panic attacks or whether they seemed to come out of the blue, the places where you began having panic attacks often can become triggers themselves. It is not uncommon for individuals to begin to avoid the places where they have had panic attacks. Over time, the individual may begin to avoid more and more places, thereby decreasing their activities and often negatively impacting their quality of life. To break the cycle of avoidance, it is important to first identify the places or situations that are being avoided, and then to do some "relearning."  To begin this intervention, first create a list of locations or situations that you tend to avoid. Then choose an avoided location or situation that you would like  to target first. Now develop an "exposure hierarchy" for this situation or location. An "exposure hierarchy" is a list of actions that make you feel anxious in this situation. Order these actions from least to most anxiety-producing. It is often helpful to have the first item on your hierarchy involve thinking or imagining part of the feared/avoided situation.   Here is an example of an exposure hierarchy for decreasing avoidance of the grocery store. Note how it is ordered from the least amount of anxiety (at the top) to the most anxiety (at the bottom):  . Think about going to the grocery store alone.  . Go to the grocery store with a friend or family member.  . Go to the grocery store alone to pick up a few small items (5-10 minutes in the store).  . Shopping for 10-20 minutes in the store alone.  . Doing the shopping for the week by myself (20-30 minutes in the store).   Your homework is to "expose" yourself to the lowest item on your hierarchy and use your breathing relaxation and coping statements (see below) to help you remain in the situation. Practice this several times during the upcoming week. Once you have mastered each item with minimal anxiety, move on to the next higher action on your list.   Cognitive Interventions  1. Identify your negative self-talk Anxious thoughts can increase anxiety symptoms and panic. The first step in changing anxious thinking is to identify your own negative, alarming self-talk. Some common alarming thoughts:  . I'm having a heart attack.            . I must be going crazy. . I think I'm dying. . People will think I'm crazy. . I'm going to pass our.  . Oh no- here it comes.  . I can't stand this.  . I've got to get out of here!  2. Use positive coping statements Changing or disrupting a pattern of anxious thoughts by replacing them with more calming or supportive statements can help to divert a panic attack. Some common helpful coping statements:   . This is not an emergency.  . I don't like feeling this way, but I can accept it.  . I can feel like this and still be okay.  . This has happened before, and I was okay. I'll be okay this time, too.  . I can be anxious and still deal with this situation.   /Emotional Wellbeing Apps and Websites Here are a few free apps meant to help you to help yourself.  To find, try searching on the internet to see if the app is offered   on Apple/Android devices. If your first choice doesn't come up on your device, the good news is that there are many choices! Play around with different apps to see which ones are helpful to you.    Calm This is an app meant to help increase calm feelings. Includes info, strategies, and tools for tracking your feelings.      Calm Harm  This app is meant to help with self-harm. Provides many 5-minute or 15-min coping strategies for doing instead of hurting yourself.       Healthy Minds Health Minds is a problem-solving tool to help deal with emotions and cope with stress you encounter wherever you are.      MindShift This app can help people cope with anxiety. Rather than trying to avoid anxiety, you can make an important shift and face it.      MY3  MY3 features a support system, safety plan and resources with the goal of offering a tool to use in a time of need.       My Life My Voice  This mood journal offers a simple solution for tracking your thoughts, feelings and moods. Animated emoticons can help identify your mood.       Relax Melodies Designed to help with sleep, on this app you can mix sounds and meditations for relaxation.      Smiling Mind Smiling Mind is meditation made easy: it's a simple tool that helps put a smile on your mind.        Stop, Breathe & Think  A friendly, simple guide for people through meditations for mindfulness and compassion.  Stop, Breathe and Think Kids Enter your current feelings and choose a "mission"  to help you cope. Offers videos for certain moods instead of just sound recordings.       Team Orange The goal of this tool is to help teens change how they think, act, and react. This app helps you focus on your own good feelings and experiences.      The Virtual Hope Box The Virtual Hope Box (VHB) contains simple tools to help patients with coping, relaxation, distraction, and positive thinking.         

## 2019-09-12 NOTE — Progress Notes (Signed)
Pt here for stat beta HCG following visit to [facility] on 09/09/19 where pt received methotrexate for a right tubal pregnancy. Beta HCG level at that time was 70. Pt is having abdominal pain that she rates 5/10 with pelvic pressure. States her right buttock continues to hurt as well as her lower back. Reports some vaginal bleeding. Ectopic precautions reviewed. Lab drawn. Pt is tearful during visit, multiple psychosocial stressors discussed. Encouraged pt to see [facility]; warm hand off completed for brief visit. Pt to schedule follow up with [name]. Explained pt should expect a phone call with results in 2 hours.  Stat Beta HCG result is 57. Reviewed with Reita Cliche, CNM who states pt may follow up with stat beta HCG on 09/15/19. States pt can expect some pain and bleeding. [facility] visit reviewed with Reita Cliche who states she would like to have a visit with pt this afternoon. Called pt with results. Pt agreeable to virtual visit at 1615 today.    Fleet Contras RN 09/12/19

## 2019-09-12 NOTE — Patient Instructions (Signed)
Panic Attack  A panic attack is when you suddenly feel very afraid, uncomfortable, or nervous (anxious). A panic attack can happen when you are scared or for no reason. A panic attack can feel like a serious problem. It can even feel like a heart attack or stroke. See your doctor when you have a panic attack to make sure you do not have a serious problem. Follow these instructions at home:  Take medicines only as told by your doctor.  If you feel worried or nervous, try not to have caffeine.  Take good care of your health. To do this: ? Eat healthy. Make sure to eat fresh fruits and vegetables, whole grains, lean meats, and low-fat dairy. ? Get enough sleep. Try to sleep for 7-8 hours each night. ? Exercise. Try to be active for 30 minutes 5 or more days a week. ? Do not smoke. Talk to your doctor if you need help quitting. ? Limit how much alcohol you drink:  If you are a woman who is not pregnant: try not to have more than 1 drink a day.  If you are a man: try not to have more than 2 drinks a day.  One drink equals 12 oz of beer, 5 oz of wine, or 1 oz of hard liquor.  Keep all follow-up visits as told by your doctor. This is important. Contact a doctor if:  Your symptoms do not get better.  Your symptoms get worse.  You are not able to take your medicines as told. Get help right away if:  You have thoughts of hurting yourself or others.  You have symptoms of a panic attack. Do not drive yourself to the hospital. Have someone else drive you or call an ambulance. If you feel like you may hurt yourself or others, or have thoughts about taking your own life, get help right away. You can go to your nearest emergency department or call:  Your local emergency services (911 in the [place]  A suicide crisis helpline, such as the National Suicide Prevention Lifeline at 1-800-273-8255. This is open 24 hours a day. Summary  A panic attack is when you suddenly feel very afraid,  uncomfortable, or nervous (anxious).  See your doctor when you have a panic attack to make sure that you do not have another serious problem.  If you feel like you may hurt yourself or others, get help right away by calling 911. This information is not intended to replace advice given to you by your health care provider. Make sure you discuss any questions you have with your health care provider. Document Revised: 04/09/2017 Document Reviewed: 06/10/2016 Elsevier Patient Education  2020 Elsevier Inc.  

## 2019-09-15 ENCOUNTER — Telehealth: Payer: Self-pay | Admitting: Family Medicine

## 2019-09-15 ENCOUNTER — Other Ambulatory Visit: Payer: Self-pay

## 2019-09-15 ENCOUNTER — Ambulatory Visit: Payer: Self-pay

## 2019-09-15 ENCOUNTER — Inpatient Hospital Stay (HOSPITAL_COMMUNITY)
Admission: AD | Admit: 2019-09-15 | Discharge: 2019-09-15 | Discharge status: Against Medical Advice | Payer: Medicaid Other | Attending: Obstetrics and Gynecology | Admitting: Obstetrics and Gynecology

## 2019-09-15 DIAGNOSIS — M545 Low back pain: Secondary | ICD-10-CM | POA: Insufficient documentation

## 2019-09-15 DIAGNOSIS — O00101 Right tubal pregnancy without intrauterine pregnancy: Secondary | ICD-10-CM

## 2019-09-15 DIAGNOSIS — N939 Abnormal uterine and vaginal bleeding, unspecified: Secondary | ICD-10-CM | POA: Diagnosis not present

## 2019-09-15 DIAGNOSIS — M79604 Pain in right leg: Secondary | ICD-10-CM | POA: Insufficient documentation

## 2019-09-15 LAB — HCG, QUANTITATIVE, PREGNANCY: hCG, Beta Chain, Quant, S: 45 m[IU]/mL — ABNORMAL HIGH (ref <5)

## 2019-09-15 NOTE — [facility] Provider Note (Addendum)
Subjective:  Jill Burke is a 34 y.o. [phone] at Unknown who presents today for FU BHCG. She was seen on 09/09/2019 and given MTX for an inappropriate change in quantitative hCG. Results from that day show no IUP on [place], possible adnexal ectopic pregnancy, and HCG 70. She reports vaginal bleeding. She denies abdominal or pelvic pain. Pt reports right-sided low back pain, and endorses right sided hip and leg pain prior to MTX.  HCG Day 1: 70 HCG Day 4: 57  Objective:  Physical Exam  Nursing note and vitals reviewed. Constitutional: She is oriented to person, place, and time. She appears well-developed and well-nourished. No distress.  HENT:  Head: Normocephalic.  Cardiovascular: Normal rate.  Respiratory: Effort normal.  GI: Soft. There is no tenderness.  Neurological: She is alert and oriented to person, place, and time. Skin: Skin is warm and dry.  Psychiatric: She has a normal mood and affect.   Results for orders placed or performed during the hospital encounter of 09/15/19 (from the past 24 hour(s))  hCG, quantitative, pregnancy     Status: Abnormal   Collection Time: 09/15/19  3:43 PM  Result Value Ref Range   hCG, Beta Chain, Quant, S 45 (H) <5 mIU/mL    Assessment/Plan: Patient endorses missing her appointment at the office today because she thought it was Thursday and not Friday. Patient also reports she must go to work tonight from [employer] - 11pm and is unable to stay for her results or an ultrasound and signed out AMA but states she plans to return after her shift. Ectopic pregnancy Consulted with Dr. Despina Hidden who confirms drop is appropriate and patient does not need a repeat dose of MTX. Will schedule patient in one week for repeat hCG in clinic and in two weeks for repeat hCG. Message sent to [facility] to schedule. HCG did drop appropriately FU in 1 weeks for: hCG lab only and two weeks for provider/lab visit As patient left AMA, attempted to call patient on home/mobile number  with results. Patient did not answer, no voicemail greeting, no voicemail left for patient as could not be certain this was her correct phone number. Patient returned phone call immediately and was identified by two identifiers and results and follow-up plan were relayed. Patient was advised because she had to leave and a full examination of her pain could not be completed that she should return to [facility] for evaluation. Pt verbalizes understanding and agrees with plan.  Marylen Ponto, NP  5:24 PM 09/15/2019

## 2019-09-15 NOTE — BH Specialist Note (Signed)
Pt did not arrive to video visit and did not answer the phone ; Left HIPPA-compliant message to call back [name] from [facility] [employer] at [facility] at [phone] (main office) or [phone] (Cutberto Winfree's office).  ; left MyChart message for patient.   Integrated Behavioral Health via Telemedicine Video Visit  09/15/2019 Jill Burke [phone]  [name]

## 2019-09-15 NOTE — [facility] Note (Signed)
Day 7 post MTX. Missed appt this morning.  Has some cramping, nothing compared to what it was(had a lot of gas).  Bleeding stopped on Wed.

## 2019-09-15 NOTE — Telephone Encounter (Signed)
Attempted to reach the patient about coming to her appointment. Left a message for her to call us back.

## 2019-09-17 ENCOUNTER — Inpatient Hospital Stay (HOSPITAL_COMMUNITY)
Admission: AD | Admit: 2019-09-17 | Discharge: 2019-09-17 | Disposition: A | Discharge status: Home / Self Care | Payer: Medicaid Other | Attending: Family Medicine | Admitting: Family Medicine

## 2019-09-17 ENCOUNTER — Inpatient Hospital Stay (HOSPITAL_COMMUNITY): Payer: Medicaid Other

## 2019-09-17 ENCOUNTER — Other Ambulatory Visit: Payer: Self-pay

## 2019-09-17 DIAGNOSIS — O00101 Right tubal pregnancy without intrauterine pregnancy: Secondary | ICD-10-CM | POA: Diagnosis not present

## 2019-09-17 DIAGNOSIS — R109 Unspecified abdominal pain: Secondary | ICD-10-CM | POA: Diagnosis present

## 2019-09-17 DIAGNOSIS — F17210 Nicotine dependence, cigarettes, uncomplicated: Secondary | ICD-10-CM | POA: Diagnosis not present

## 2019-09-17 LAB — COMPREHENSIVE METABOLIC PANEL
ALT: 23 U/L (ref 0–44)
AST: 17 U/L (ref 15–41)
Albumin: 4.1 g/dL (ref 3.5–5.0)
Alkaline Phosphatase: 50 U/L (ref 38–126)
Anion gap: 8 (ref 5–15)
BUN: 13 mg/dL (ref 6–20)
CO2: 24 mmol/L (ref 22–32)
Calcium: 9.3 mg/dL (ref 8.9–10.3)
Chloride: 110 mmol/L (ref 98–111)
Creatinine, Ser: 0.78 mg/dL (ref 0.44–1.00)
GFR calc Af Amer: >60 mL/min (ref >60)
GFR calc non Af Amer: >60 mL/min (ref >60)
Glucose, Bld: 118 mg/dL — ABNORMAL HIGH (ref 70–99)
Potassium: 4 mmol/L (ref 3.5–5.1)
Sodium: 142 mmol/L (ref 135–145)
Total Bilirubin: 0.6 mg/dL (ref 0.3–1.2)
Total Protein: 6.7 g/dL (ref 6.5–8.1)

## 2019-09-17 LAB — CBC
HCT: 45.7 % (ref 36.0–46.0)
Hemoglobin: 15.2 g/dL — ABNORMAL HIGH (ref 12.0–15.0)
MCH: 31.9 pg (ref 26.0–34.0)
MCHC: 33.3 g/dL (ref 30.0–36.0)
MCV: 96 fL (ref 80.0–100.0)
Platelets: 276 10*3/uL (ref 150–400)
RBC: 4.76 MIL/uL (ref 3.87–5.11)
RDW: 11.9 % (ref 11.5–15.5)
WBC: 9.9 10*3/uL (ref 4.0–10.5)
nRBC: 0 % (ref 0.0–0.2)

## 2019-09-17 LAB — HCG, QUANTITATIVE, PREGNANCY: hCG, Beta Chain, Quant, S: 44 m[IU]/mL — ABNORMAL HIGH (ref <5)

## 2019-09-17 MED ORDER — ACETAMINOPHEN 500 MG PO TABS
1000.0000 mg | ORAL_TABLET | Freq: Once | ORAL | Status: AC
  Administered 2019-09-17: 1000 mg via ORAL
  Filled 2019-09-17: qty 2

## 2019-09-17 MED ORDER — METHOTREXATE FOR ECTOPIC PREGNANCY
50.0000 mg/m2 | Freq: Once | INTRAMUSCULAR | Status: AC
  Administered 2019-09-17: 95 mg via INTRAMUSCULAR
  Filled 2019-09-17: qty 1

## 2019-09-17 MED ORDER — HYDROMORPHONE HCL 1 MG/ML IJ SOLN
0.5000 mg | Freq: Once | INTRAMUSCULAR | Status: AC
  Administered 2019-09-17: 0.5 mg via INTRAMUSCULAR
  Filled 2019-09-17: qty 1

## 2019-09-17 NOTE — MAU Provider Note (Signed)
History     CSN: [id number]  Arrival date and time: 09/17/19 1149  Chief Complaint  Patient presents with  . Abdominal Pain   HPI Jill Burke is a 34 y.o. [phone] s/p methotrexate on 5/1 who presents with a sudden onset of sharp shooting pain in her right lower quadrant that radiates up to her armpit and lower back. She rates the pain a 10/10 and tried tylenol with no relief. She denies any vaginal bleeding or discharge.   OB History    Gravida  4   Para  3   Term  3   Preterm      AB      Living  3     SAB      TAB      Ectopic      Multiple      Live Births              Past Medical History:  Diagnosis Date  . Chlamydia 2007   treated    No past surgical history on file.  Family History  Problem Relation Age of Onset  . Hypertension Father   . Heart disease Father   . Hyperlipidemia Father   . Stroke Father   . Diabetes Paternal Grandmother   . Diabetes Mother     Social History   Tobacco Use  . Smoking status: Current Every Day Smoker    Packs/day: 0.50    Years: 3.00    Pack years: 1.50  . Smokeless tobacco: Never Used  Substance Use Topics  . Alcohol use: No    Alcohol/week: 0.0 standard drinks  . Drug use: No    Allergies:  Allergies  Allergen Reactions  . Codeine Other (See Comments)    Does not remember     Medications Prior to Admission  Medication Sig Dispense Refill Last Dose  . acetaminophen (TYLENOL) 500 MG tablet Take 1,000 mg by mouth every 6 (six) hours as needed.     . ALPRAZolam (XANAX) 0.5 MG tablet Take 1 tablet (0.5 mg total) by mouth 3 (three) times daily as needed for anxiety. 30 tablet 0   . Calcium Carbonate-Vitamin D 600-200 MG-UNIT TABS Take by mouth.     . doxycycline (VIBRAMYCIN) 100 MG capsule Take by mouth.     . fluconazole (DIFLUCAN) 150 MG tablet Take 150 mg by mouth once.     . hydrOXYzine (VISTARIL) 50 MG capsule Take 1 capsule (50 mg total) by mouth 3 (three) times daily as needed. 30  capsule 0   . metroNIDAZOLE (METROGEL) 0.75 % vaginal gel Place vaginally at bedtime.     . ondansetron (ZOFRAN-ODT) 4 MG disintegrating tablet Take by mouth.     . promethazine (PHENERGAN) 25 MG tablet Take 1 tablet (25 mg total) by mouth every 6 (six) hours as needed for nausea or vomiting. (Patient not taking: Reported on 09/12/2019) 30 tablet 0     Review of Systems  Constitutional: Negative.  Negative for fatigue and fever.  HENT: Negative.   Respiratory: Negative.  Negative for shortness of breath.   Cardiovascular: Negative.  Negative for chest pain.  Gastrointestinal: Positive for abdominal pain. Negative for constipation, diarrhea, nausea and vomiting.  Genitourinary: Negative.  Negative for dysuria, vaginal bleeding and vaginal discharge.  Musculoskeletal: Positive for back pain.  Neurological: Negative.  Negative for dizziness and headaches.   Physical Exam   Blood pressure 122/84, pulse 87, temperature 98.8 F (37.1 C), temperature source Oral, resp.  rate 18, height 5\' 2"  (1.575 m), weight 78.3 kg, SpO2 98 %.  Physical Exam  Nursing note and vitals reviewed. Constitutional: She is oriented to person, place, and time. She appears well-developed and well-nourished. No distress.  HENT:  Head: Normocephalic.  Eyes: Pupils are equal, round, and reactive to light.  Cardiovascular: Normal rate, regular rhythm and normal heart sounds.  Respiratory: Effort normal and breath sounds normal. No respiratory distress.  GI: Soft. Bowel sounds are normal. She exhibits no distension and no mass. There is abdominal tenderness (RLQ tenderness). There is no rebound and no guarding.  Neurological: She is alert and oriented to person, place, and time.  Skin: Skin is warm and dry.  Psychiatric: She has a normal mood and affect. Her behavior is normal. Judgment and thought content normal.    MAU Course  Procedures Results for orders placed or performed during the hospital encounter of 09/17/19  (from the past 24 hour(s))  CBC     Status: Abnormal   Collection Time: 09/17/19 12:24 PM  Result Value Ref Range   WBC 9.9 4.0 - 10.5 K/uL   RBC 4.76 3.87 - 5.11 MIL/uL   Hemoglobin 15.2 (H) 12.0 - 15.0 g/dL   HCT 45.7 36.0 - 46.0 %   MCV 96.0 80.0 - 100.0 fL   MCH 31.9 26.0 - 34.0 pg   MCHC 33.3 30.0 - 36.0 g/dL   RDW 11.9 11.5 - 15.5 %   Platelets 276 150 - 400 K/uL   nRBC 0.0 0.0 - 0.2 %  Comprehensive metabolic panel     Status: Abnormal   Collection Time: 09/17/19 12:24 PM  Result Value Ref Range   Sodium 142 135 - 145 mmol/L   Potassium 4.0 3.5 - 5.1 mmol/L   Chloride 110 98 - 111 mmol/L   CO2 24 22 - 32 mmol/L   Glucose, Bld 118 (H) 70 - 99 mg/dL   BUN 13 6 - 20 mg/dL   Creatinine, Ser 0.78 0.44 - 1.00 mg/dL   Calcium 9.3 8.9 - 10.3 mg/dL   Total Protein 6.7 6.5 - 8.1 g/dL   Albumin 4.1 3.5 - 5.0 g/dL   AST 17 15 - 41 U/L   ALT 23 0 - 44 U/L   Alkaline Phosphatase 50 38 - 126 U/L   Total Bilirubin 0.6 0.3 - 1.2 mg/dL   GFR calc non Af Amer >60 >60 mL/min   GFR calc Af Amer >60 >60 mL/min   Anion gap 8 5 - 15  hCG, quantitative, pregnancy     Status: Abnormal   Collection Time: 09/17/19 12:24 PM  Result Value Ref Range   hCG, Beta Chain, Quant, S 44 (H) <5 mIU/mL   US OB Transvaginal  Result Date: 09/17/2019 CLINICAL DATA:  Ectopic pregnancy treated with methotrexate. Abdominal pain. EXAM: TRANSVAGINAL OB ULTRASOUND TECHNIQUE: Transvaginal ultrasound was performed for complete evaluation of the gestation as well as the maternal uterus, adnexal regions, and pelvic cul-de-sac. COMPARISON:  09/09/2019 FINDINGS: Intrauterine gestational sac: None Maternal uterus/adnexae: Endometrial thickness measures 5 mm. No fibroids identified. Both ovaries are normal in appearance. A hypoechoic mass is again seen in the right adnexa. This currently measures 1.5 x 1.1 x 1.4 cm, mildly decreased in size from 2.6 x 1.4 by 1.4 cm on prior exam. A tiny amount of simple free fluid is noted in  the cul-de-sac. IMPRESSION: 1.5 cm right adnexal mass shows mild decrease in size since previous study, consistent with treated ectopic pregnancy. Tiny amount  of simple free fluid in pelvic cul-de-sac. Electronically Signed   By: Danae Orleans M.D.   On: 09/17/2019 13:45    MDM CBC, CMP, HCG US OB Transvaginal  Reviewed results with Dr. Shawnie Pons- recommends second dose of MTX today. Recommendations discussed with patient and patient agreeable to plan of care and required follow up.   MTX  Assessment and Plan   1. Right tubal pregnancy without intrauterine pregnancy    -Discharge home in stable condition -Strict ectopic precautions discussed -Patient advised to follow-up with [facility] on Wednesday for repeat HCG -Patient may return to MAU as needed or if her condition were to change or worsen   Rolm Bookbinder CNM 09/17/2019, 2:37 PM

## 2019-09-17 NOTE — Discharge Instructions (Signed)
Methotrexate Treatment for an Ectopic Pregnancy, Care After This sheet gives you information about how to care for yourself after your procedure. Your health care provider may also give you more specific instructions. If you have problems or questions, contact your health care provider. What can I expect after the procedure? After the procedure, it is common to have:  Abdominal cramping.  Vaginal bleeding.  Fatigue.  Nausea.  Vomiting.  Diarrhea. Blood tests will be taken at timed intervals for several days or weeks to check your pregnancy hormone levels. The blood tests will be done until the pregnancy hormone can no longer be detected in the blood. Follow these instructions at home: Activity  Do not have sex until your health care provider approves.  Limit activities that take a lot of effort as told by your health care provider. Medicines  Take over the counter and prescription medicines only as told by your health care provider.  Do not take aspirin, ibuprofen, naproxen, or any other NSAIDs.  Do not take folic acid, prenatal vitamins, or other vitamins that contain folic acid. General instructions   Do not drink alcohol.  Follow instructions from your health care provider on how and when to report any symptoms that may indicate a ruptured ectopic pregnancy.  Keep all follow-up visits as told by your health care provider. This is important. Contact a health care provider if:  You have persistent nausea and vomiting.  You have persistent diarrhea.  You are having a reaction to the medicine, such as: ? Tiredness. ? Skin rash. ? Hair loss. Get help right away if:  Your abdominal or pelvic pain gets worse.  You have more vaginal bleeding.  You feel light-headed or you faint.  You have shortness of breath.  Your heart rate increases.  You develop a cough.  You have chills.  You have a fever. Summary  After the procedure, it is common to have symptoms  of abdominal cramping, vaginal bleeding and fatigue. You may also experience other symptoms.  Blood tests will be taken at timed intervals for several days or weeks to check your pregnancy hormone levels. The blood tests will be done until the pregnancy hormone can no longer be detected in the blood.  Limit strenuous activity as told by your health care provider.  Follow instructions from your health care provider on how and when to report any symptoms that may indicate a ruptured ectopic pregnancy. This information is not intended to replace advice given to you by your health care provider. Make sure you discuss any questions you have with your health care provider. Document Revised: 04/09/2017 Document Reviewed: 06/16/2016 Elsevier Patient Education  2020 [employer]   Ectopic Pregnancy  An ectopic pregnancy happens when a fertilized egg grows outside the womb (uterus). The fertilized egg cannot stay alive outside of the womb. This problem often happens in a fallopian tube. It is often caused by damage to the tube. If this problem is found early, you may be treated with medicine that stops the egg from growing. If your tube tears or bursts open (ruptures), you will bleed inside. Often, there is very bad pain in the lower belly. This is an emergency. You will need surgery. Get help right away. Follow these instructions at home: After being treated with medicine or surgery:  Rest and limit your activity for as long as told by your doctor.  Until your doctor says that it is safe: ? Do not lift anything that is heavier than   10 lb (4.5 kg) or the limit that your doctor tells you. ? Avoid exercise and any movement that takes a lot of effort.  To prevent problems when pooping (constipation): ? Eat a healthy diet. This includes:  Fruits.  Vegetables.  Whole grains. ? Drink 6-8 glasses of water a day. Contact a doctor if: Get help right away if:  You have sudden and very bad pain in  your belly.  You have very bad pain in your shoulders or neck.  You have pain that gets worse and is not helped by medicine.  You have: ? A fever or chills. ? Vaginal bleeding. ? Redness or swelling at the site of a surgical cut (incision).  You feel sick to your stomach (nauseous) or you throw up (vomit).  You feel dizzy or weak.  You feel light-headed or you pass out (faint). Summary  An ectopic pregnancy happens when a fertilized egg grows outside the womb (uterus).  If this problem is found early, you may be treated with medicine that stops the egg from growing.  If your tube tears or bursts open (ruptures), you will need surgery. This is an emergency. Get help right away. This information is not intended to replace advice given to you by your health care provider. Make sure you discuss any questions you have with your health care provider. Document Revised: 04/09/2017 Document Reviewed: 05/21/2016 Elsevier Patient Education  2020 [employer] Inc.  

## 2019-09-17 NOTE — MAU Note (Signed)
Jill Burke is a 34 y.o. here in MAU reporting: worsening abdominal and back pain since Friday. Has tried tylenol with no relief. No bleeding.  Onset of complaint: getting worse since Friday  Pain score: 10/10  Vitals:   09/17/19 1207  BP: 122/84  Pulse: 87  Resp: 18  Temp: 98.8 F (37.1 C)  SpO2: 98%     Lab orders placed from triage: labs ordered by provider

## 2019-09-18 ENCOUNTER — Ambulatory Visit: Payer: Self-pay | Admitting: Clinical

## 2019-09-18 DIAGNOSIS — Z91199 Patient's noncompliance with other medical treatment and regimen due to unspecified reason: Secondary | ICD-10-CM

## 2019-09-18 DIAGNOSIS — Z5329 Procedure and treatment not carried out because of patient's decision for other reasons: Secondary | ICD-10-CM

## 2019-09-20 ENCOUNTER — Telehealth: Payer: Self-pay | Admitting: *Deleted

## 2019-09-20 ENCOUNTER — Encounter: Payer: Self-pay | Admitting: *Deleted

## 2019-09-20 ENCOUNTER — Ambulatory Visit: Payer: Self-pay

## 2019-09-20 NOTE — Telephone Encounter (Signed)
Called pt and left VM message stating I am calling regarding a missed appointment from today which is very important. Please call back to re-schedule for tomorrow. MyChart message also sent.

## 2019-09-21 ENCOUNTER — Other Ambulatory Visit: Payer: Self-pay

## 2019-09-21 ENCOUNTER — Ambulatory Visit (INDEPENDENT_AMBULATORY_CARE_PROVIDER_SITE_OTHER): Payer: Self-pay

## 2019-09-21 ENCOUNTER — Emergency Department (HOSPITAL_COMMUNITY)
Admission: EM | Admit: 2019-09-21 | Discharge: 2019-09-22 | Disposition: A | Discharge status: Home / Self Care | Payer: Medicaid Other | Attending: Emergency Medicine | Admitting: Emergency Medicine

## 2019-09-21 DIAGNOSIS — M25562 Pain in left knee: Secondary | ICD-10-CM | POA: Diagnosis not present

## 2019-09-21 DIAGNOSIS — R519 Headache, unspecified: Secondary | ICD-10-CM | POA: Insufficient documentation

## 2019-09-21 DIAGNOSIS — Z5321 Procedure and treatment not carried out due to patient leaving prior to being seen by health care provider: Secondary | ICD-10-CM | POA: Insufficient documentation

## 2019-09-21 DIAGNOSIS — M545 Low back pain: Secondary | ICD-10-CM | POA: Insufficient documentation

## 2019-09-21 DIAGNOSIS — O00101 Right tubal pregnancy without intrauterine pregnancy: Secondary | ICD-10-CM

## 2019-09-21 LAB — BETA HCG QUANT (REF LAB): hCG Quant: 26 m[IU]/mL

## 2019-09-21 NOTE — Progress Notes (Signed)
Pt here today for STAT Beta Lab s/p second dose of MTX given on 09/17/19.  Pt is here today for MTX day 4 due to pt missed appt that was scheduled for 09/20/19.  Pt denies vaginal bleeding and is having mild right lower back pain that she rates her pain 6 out of 1-10 pain scale.  Pt explained that I will call her with results and f/u in at least two hours.  Pt verbalized understanding.    Received notification from [facility] that pt's beta results are 26.  Reviewed results with Dr. Macon Large who recommends that pt returns to MAU for day 7 MTX Stat beta draw.  Notified pt provider's recommendation.  Pt stated that she will be able to come into MAU on 09/23/19 around 1300 for her STAT beta lab draw.  MAU, Ginger, RN notified.   Addison Naegeli, RN  09/21/19

## 2019-09-21 NOTE — Progress Notes (Signed)
Patient was assessed and managed by nursing staff during this encounter. I have reviewed the chart and agree with the documentation and plan. I have also made any necessary editorial changes.  Jaynie Collins, MD 09/21/2019 4:39 PM

## 2019-09-22 ENCOUNTER — Other Ambulatory Visit: Payer: Self-pay

## 2019-09-22 ENCOUNTER — Encounter (HOSPITAL_COMMUNITY): Payer: Self-pay | Admitting: Emergency Medicine

## 2019-09-22 LAB — BASIC METABOLIC PANEL
Anion gap: 11 (ref 5–15)
BUN: 9 mg/dL (ref 6–20)
CO2: 25 mmol/L (ref 22–32)
Calcium: 9.2 mg/dL (ref 8.9–10.3)
Chloride: 102 mmol/L (ref 98–111)
Creatinine, Ser: 0.87 mg/dL (ref 0.44–1.00)
GFR calc Af Amer: >60 mL/min (ref >60)
GFR calc non Af Amer: >60 mL/min (ref >60)
Glucose, Bld: 148 mg/dL — ABNORMAL HIGH (ref 70–99)
Potassium: 4 mmol/L (ref 3.5–5.1)
Sodium: 138 mmol/L (ref 135–145)

## 2019-09-22 LAB — CBC WITH DIFFERENTIAL/PLATELET
Abs Immature Granulocytes: 0.09 10*3/uL — ABNORMAL HIGH (ref 0.00–0.07)
Basophils Absolute: 0.1 10*3/uL (ref 0.0–0.1)
Basophils Relative: 0 %
Eosinophils Absolute: 0.1 10*3/uL (ref 0.0–0.5)
Eosinophils Relative: 1 %
HCT: 44.7 % (ref 36.0–46.0)
Hemoglobin: 14.8 g/dL (ref 12.0–15.0)
Immature Granulocytes: 1 %
Lymphocytes Relative: 17 %
Lymphs Abs: 2.7 10*3/uL (ref 0.7–4.0)
MCH: 32.7 pg (ref 26.0–34.0)
MCHC: 33.1 g/dL (ref 30.0–36.0)
MCV: 98.7 fL (ref 80.0–100.0)
Monocytes Absolute: 0.5 10*3/uL (ref 0.1–1.0)
Monocytes Relative: 3 %
Neutro Abs: 12.5 10*3/uL — ABNORMAL HIGH (ref 1.7–7.7)
Neutrophils Relative %: 78 %
Platelets: 339 10*3/uL (ref 150–400)
RBC: 4.53 MIL/uL (ref 3.87–5.11)
RDW: 12.1 % (ref 11.5–15.5)
WBC: 15.9 10*3/uL — ABNORMAL HIGH (ref 4.0–10.5)
nRBC: 0 % (ref 0.0–0.2)

## 2019-09-22 LAB — I-STAT BETA HCG BLOOD, ED (MC, WL, AP ONLY): I-stat hCG, quantitative: 23.3 m[IU]/mL — ABNORMAL HIGH (ref <5)
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
## 2019-09-23 ENCOUNTER — Other Ambulatory Visit: Payer: Self-pay

## 2019-09-23 ENCOUNTER — Inpatient Hospital Stay (HOSPITAL_COMMUNITY)
Admission: AD | Admit: 2019-09-23 | Discharge: 2019-09-26 | Disposition: A | Discharge status: Home / Self Care | Payer: Medicaid Other | Attending: Student | Admitting: Student

## 2019-09-23 DIAGNOSIS — O00101 Right tubal pregnancy without intrauterine pregnancy: Secondary | ICD-10-CM | POA: Insufficient documentation

## 2019-09-23 DIAGNOSIS — T1490XA Injury, unspecified, initial encounter: Secondary | ICD-10-CM | POA: Diagnosis not present

## 2019-09-23 DIAGNOSIS — M542 Cervicalgia: Secondary | ICD-10-CM | POA: Diagnosis not present

## 2019-09-23 DIAGNOSIS — R0781 Pleurodynia: Secondary | ICD-10-CM | POA: Diagnosis not present

## 2019-09-23 DIAGNOSIS — R519 Headache, unspecified: Secondary | ICD-10-CM | POA: Insufficient documentation

## 2019-09-23 LAB — HCG, QUANTITATIVE, PREGNANCY: hCG, Beta Chain, Quant, S: 6 m[IU]/mL — ABNORMAL HIGH (ref <5)
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
## 2019-09-26 ENCOUNTER — Other Ambulatory Visit: Payer: BLUE CROSS/BLUE SHIELD

## 2019-09-26 ENCOUNTER — Other Ambulatory Visit: Payer: Self-pay

## 2019-09-26 DIAGNOSIS — O00109 Unspecified tubal pregnancy without intrauterine pregnancy: Secondary | ICD-10-CM

## 2019-09-27 ENCOUNTER — Telehealth (INDEPENDENT_AMBULATORY_CARE_PROVIDER_SITE_OTHER): Payer: BLUE CROSS/BLUE SHIELD | Admitting: Family Medicine

## 2019-09-27 DIAGNOSIS — O00109 Unspecified tubal pregnancy without intrauterine pregnancy: Secondary | ICD-10-CM

## 2019-09-27 LAB — BETA HCG QUANT (REF LAB): hCG Quant: <1 m[IU]/mL

## 2019-09-27 NOTE — Telephone Encounter (Signed)
Called patient to inform her that Beta Hcg < 1.  Patient did not answer. LM for her to call the office at her convenience.   Sent My Chart message.

## 2019-09-27 NOTE — Telephone Encounter (Addendum)
Patient returned call at 10:43 am. She was informed of Beta Hcg level and that usually when less than that no further up follow up is needed. Will call patient if provider decides she needs further follow up. Patient with no questions or concerns at this time.   Patient called back again and wants to know if she can restart her OCP's. She denies being sexually active in the last few weeks.   Spoke with Dr. Alysia Penna and he reports she can restart on Sunday. Patient plans to use a back up method of Birth Control for the first month also.

## 2019-09-27 NOTE — Telephone Encounter (Signed)
Patient is calling to get test results from yesterday

## 2019-09-30 ENCOUNTER — Other Ambulatory Visit: Payer: Self-pay

## 2019-09-30 ENCOUNTER — Emergency Department (HOSPITAL_COMMUNITY): Payer: Medicaid Other

## 2019-09-30 ENCOUNTER — Encounter (HOSPITAL_COMMUNITY): Payer: Self-pay | Admitting: *Deleted

## 2019-09-30 ENCOUNTER — Emergency Department (HOSPITAL_COMMUNITY)
Admission: EM | Admit: 2019-09-30 | Discharge: 2019-09-30 | Disposition: A | Discharge status: Home / Self Care | Payer: Medicaid Other | Attending: Emergency Medicine | Admitting: Emergency Medicine

## 2019-09-30 DIAGNOSIS — S0083XD Contusion of other part of head, subsequent encounter: Secondary | ICD-10-CM | POA: Diagnosis not present

## 2019-09-30 DIAGNOSIS — F17210 Nicotine dependence, cigarettes, uncomplicated: Secondary | ICD-10-CM | POA: Diagnosis not present

## 2019-09-30 DIAGNOSIS — R079 Chest pain, unspecified: Secondary | ICD-10-CM | POA: Diagnosis not present

## 2019-09-30 DIAGNOSIS — W19XXXA Unspecified fall, initial encounter: Secondary | ICD-10-CM

## 2019-09-30 DIAGNOSIS — S199XXA Unspecified injury of neck, initial encounter: Secondary | ICD-10-CM | POA: Diagnosis present

## 2019-09-30 DIAGNOSIS — S161XXA Strain of muscle, fascia and tendon at neck level, initial encounter: Secondary | ICD-10-CM | POA: Insufficient documentation

## 2019-09-30 DIAGNOSIS — W108XXA Fall (on) (from) other stairs and steps, initial encounter: Secondary | ICD-10-CM | POA: Diagnosis not present

## 2019-09-30 DIAGNOSIS — Y9289 Other specified places as the place of occurrence of the external cause: Secondary | ICD-10-CM | POA: Diagnosis not present

## 2019-09-30 DIAGNOSIS — Y9389 Activity, other specified: Secondary | ICD-10-CM | POA: Diagnosis not present

## 2019-09-30 DIAGNOSIS — Y998 Other external cause status: Secondary | ICD-10-CM | POA: Diagnosis not present

## 2019-09-30 DIAGNOSIS — S39012A Strain of muscle, fascia and tendon of lower back, initial encounter: Secondary | ICD-10-CM | POA: Diagnosis not present

## 2019-09-30 LAB — POC URINE PREG, ED: Preg Test, Ur: NEGATIVE

## 2019-09-30 MED ORDER — OXYCODONE-ACETAMINOPHEN 5-325 MG PO TABS
1.0000 | ORAL_TABLET | Freq: Once | ORAL | Status: AC
  Administered 2019-09-30: 1 via ORAL
  Filled 2019-09-30: qty 1

## 2019-09-30 MED ORDER — OXYCODONE-ACETAMINOPHEN 5-325 MG PO TABS: 1.0000 | ORAL_TABLET | Freq: Once | ORAL | Status: DC

## 2019-09-30 MED ORDER — ONDANSETRON 4 MG PO TBDP: 4.0000 mg | ORAL_TABLET | Freq: Three times a day (TID) | ORAL | 0 refills | Status: DC | PRN

## 2019-09-30 MED ORDER — OXYCODONE-ACETAMINOPHEN 5-325 MG PO TABS: 1.0000 | ORAL_TABLET | Freq: Four times a day (QID) | ORAL | 0 refills | Status: DC | PRN
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
## 2019-10-02 ENCOUNTER — Other Ambulatory Visit: Payer: BLUE CROSS/BLUE SHIELD

## 2019-10-07 ENCOUNTER — Emergency Department (HOSPITAL_COMMUNITY)
Admission: EM | Admit: 2019-10-07 | Discharge: 2019-10-08 | Disposition: A | Discharge status: Home / Self Care | Payer: Medicaid Other | Attending: Emergency Medicine | Admitting: Emergency Medicine

## 2019-10-07 ENCOUNTER — Other Ambulatory Visit: Payer: Self-pay

## 2019-10-07 ENCOUNTER — Encounter (HOSPITAL_COMMUNITY): Payer: Self-pay | Admitting: Emergency Medicine

## 2019-10-07 DIAGNOSIS — F17210 Nicotine dependence, cigarettes, uncomplicated: Secondary | ICD-10-CM | POA: Diagnosis not present

## 2019-10-07 DIAGNOSIS — Y93I9 Activity, other involving external motion: Secondary | ICD-10-CM | POA: Diagnosis not present

## 2019-10-07 DIAGNOSIS — Y92481 Parking lot as the place of occurrence of the external cause: Secondary | ICD-10-CM | POA: Diagnosis not present

## 2019-10-07 DIAGNOSIS — M791 Myalgia, unspecified site: Secondary | ICD-10-CM | POA: Diagnosis not present

## 2019-10-07 DIAGNOSIS — Z91040 Latex allergy status: Secondary | ICD-10-CM | POA: Diagnosis not present

## 2019-10-07 DIAGNOSIS — Y999 Unspecified external cause status: Secondary | ICD-10-CM | POA: Diagnosis not present

## 2019-10-07 DIAGNOSIS — M7918 Myalgia, other site: Secondary | ICD-10-CM

## 2019-10-07 DIAGNOSIS — Z79899 Other long term (current) drug therapy: Secondary | ICD-10-CM | POA: Diagnosis not present

## 2019-10-07 DIAGNOSIS — M79605 Pain in left leg: Secondary | ICD-10-CM | POA: Diagnosis present
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
## 2019-10-08 MED ORDER — METHOCARBAMOL 500 MG PO TABS
1000.0000 mg | ORAL_TABLET | Freq: Once | ORAL | Status: AC
  Administered 2019-10-08: 1000 mg via ORAL
  Filled 2019-10-08: qty 2

## 2019-10-08 MED ORDER — IBUPROFEN 200 MG PO TABS
600.0000 mg | ORAL_TABLET | Freq: Once | ORAL | Status: AC
  Administered 2019-10-08: 600 mg via ORAL
  Filled 2019-10-08: qty 3
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
## 2019-10-08 NOTE — Discharge Instructions (Addendum)
Take your Robaxin and Relafen at home twice a day on a regular basis for at least the next 4 days. Heat to the sore areas.   Follow up with your doctor after the holiday weekend for recheck.
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
## 2019-10-09 ENCOUNTER — Encounter (HOSPITAL_COMMUNITY): Payer: Self-pay | Admitting: Emergency Medicine

## 2019-10-09 ENCOUNTER — Emergency Department (HOSPITAL_COMMUNITY): Payer: Medicaid Other

## 2019-10-09 ENCOUNTER — Emergency Department (HOSPITAL_COMMUNITY)
Admission: EM | Admit: 2019-10-09 | Discharge: 2019-10-09 | Discharge status: Against Medical Advice | Payer: Medicaid Other | Attending: Emergency Medicine | Admitting: Emergency Medicine

## 2019-10-09 DIAGNOSIS — R0789 Other chest pain: Secondary | ICD-10-CM | POA: Diagnosis not present

## 2019-10-09 DIAGNOSIS — T07XXXA Unspecified multiple injuries, initial encounter: Secondary | ICD-10-CM | POA: Diagnosis present

## 2019-10-09 DIAGNOSIS — Y939 Activity, unspecified: Secondary | ICD-10-CM | POA: Diagnosis not present

## 2019-10-09 DIAGNOSIS — Y999 Unspecified external cause status: Secondary | ICD-10-CM | POA: Diagnosis not present

## 2019-10-09 DIAGNOSIS — H53149 Visual discomfort, unspecified: Secondary | ICD-10-CM | POA: Diagnosis not present

## 2019-10-09 DIAGNOSIS — Z5320 Procedure and treatment not carried out because of patient's decision for unspecified reasons: Secondary | ICD-10-CM | POA: Diagnosis not present

## 2019-10-09 DIAGNOSIS — R519 Headache, unspecified: Secondary | ICD-10-CM | POA: Diagnosis not present

## 2019-10-09 DIAGNOSIS — R110 Nausea: Secondary | ICD-10-CM | POA: Insufficient documentation

## 2019-10-09 DIAGNOSIS — S161XXA Strain of muscle, fascia and tendon at neck level, initial encounter: Secondary | ICD-10-CM | POA: Diagnosis not present

## 2019-10-09 DIAGNOSIS — F17210 Nicotine dependence, cigarettes, uncomplicated: Secondary | ICD-10-CM | POA: Diagnosis not present

## 2019-10-09 DIAGNOSIS — Y92481 Parking lot as the place of occurrence of the external cause: Secondary | ICD-10-CM | POA: Diagnosis not present

## 2019-10-09 DIAGNOSIS — S060X0A Concussion without loss of consciousness, initial encounter: Secondary | ICD-10-CM

## 2019-10-09 MED ORDER — DIPHENHYDRAMINE HCL 50 MG/ML IJ SOLN
25.0000 mg | Freq: Once | INTRAMUSCULAR | Status: AC
  Administered 2019-10-09: 25 mg via INTRAVENOUS
  Filled 2019-10-09: qty 1

## 2019-10-09 MED ORDER — KETOROLAC TROMETHAMINE 15 MG/ML IJ SOLN
15.0000 mg | Freq: Once | INTRAMUSCULAR | Status: AC
  Administered 2019-10-09: 15 mg via INTRAVENOUS
  Filled 2019-10-09: qty 1

## 2019-10-09 MED ORDER — METOCLOPRAMIDE HCL 5 MG/ML IJ SOLN
10.0000 mg | Freq: Once | INTRAMUSCULAR | Status: AC
  Administered 2019-10-09: 10 mg via INTRAVENOUS
  Filled 2019-10-09: qty 2

## 2019-10-09 NOTE — ED Notes (Signed)
PT came to [facility], st's she has to leave now.  Advised her that I would let the MD know.  Encouraged pt to wait for discharge instructions.  Dr. Criss Alvine made aware and went to room to speak with pt.  Pt not in room

## 2019-10-09 NOTE — ED Notes (Signed)
Pt transported to xray 
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
## 2019-10-10 ENCOUNTER — Other Ambulatory Visit: Payer: BLUE CROSS/BLUE SHIELD

## 2019-10-17 ENCOUNTER — Other Ambulatory Visit: Payer: BLUE CROSS/BLUE SHIELD

## 2019-12-15 ENCOUNTER — Encounter: Payer: Self-pay | Admitting: Physician Assistant

## 2019-12-19 ENCOUNTER — Other Ambulatory Visit: Payer: Self-pay | Admitting: Family Medicine

## 2019-12-19 DIAGNOSIS — G44309 Post-traumatic headache, unspecified, not intractable: Secondary | ICD-10-CM

## 2019-12-19 DIAGNOSIS — N63 Unspecified lump in unspecified breast: Secondary | ICD-10-CM

## 2019-12-19 DIAGNOSIS — R519 Headache, unspecified: Secondary | ICD-10-CM

## 2019-12-22 ENCOUNTER — Ambulatory Visit (INDEPENDENT_AMBULATORY_CARE_PROVIDER_SITE_OTHER): Payer: 59

## 2019-12-22 ENCOUNTER — Other Ambulatory Visit (HOSPITAL_COMMUNITY)
Admission: RE | Admit: 2019-12-22 | Discharge: 2019-12-22 | Disposition: A | Discharge status: Home / Self Care | Payer: 59 | Source: Ambulatory Visit | Attending: Family Medicine | Admitting: Family Medicine

## 2019-12-22 DIAGNOSIS — N898 Other specified noninflammatory disorders of vagina: Secondary | ICD-10-CM | POA: Insufficient documentation

## 2019-12-22 DIAGNOSIS — R102 Pelvic and perineal pain unspecified side: Secondary | ICD-10-CM

## 2019-12-22 DIAGNOSIS — R350 Frequency of micturition: Secondary | ICD-10-CM

## 2019-12-22 DIAGNOSIS — R319 Hematuria, unspecified: Secondary | ICD-10-CM

## 2019-12-22 LAB — POCT URINALYSIS DIP (DEVICE)
Bilirubin Urine: NEGATIVE
Glucose, UA: NEGATIVE mg/dL
Ketones, ur: NEGATIVE mg/dL
Leukocytes,Ua: NEGATIVE
Nitrite: NEGATIVE
Protein, ur: NEGATIVE mg/dL
Specific Gravity, Urine: 1.025 (ref 1.005–1.030)
Urobilinogen, UA: 0.2 mg/dL (ref 0.0–1.0)
pH: 6.5 (ref 5.0–8.0)

## 2019-12-22 NOTE — Progress Notes (Deleted)
Vaginal discharge light white clear, odor  Right lower abdomen and right lower back constant pain, occasional pain on right and left mid abdomen  OCPs last in April   Methotrexate tubal pregnancy x 2  chantix would like to quit smoking

## 2019-12-22 NOTE — Progress Notes (Signed)
Pt here today with multiple complaints. Pt has been seen by several health care providers over past few months per chart review. Hx significant for recent tubal pregnancy requiring methotrexate x2. Our office completed follow up for this and is managing [facility] needs.   Today pt complains of urinary frequency, constant back and pelvic pain, intermittent abdominal pain, vaginal discharge with odor. UA shows trace blood. Pt reports some vaginal bleeding 2.5 weeks ago, does not believe she has had a normal period since discontinuing OCPs in April.  Urine sent for culture. Self-swab instructions given and specimen obtained for vaginal infection screening. Explained we will call with results. Encouraged pt to follow up with [facility] for anxiety and depression (screenings positive today, no SI) and provider for gyn issues.  Reviewed pt's history and symptoms with Anyanwu, MD who gives agrees with plan of care and gives recommendation for pelvic US. Pt given provider recommendation and agrees to schedule appts for pelvic US, f/u with [facility], and annual exam with OB/GYN to discuss pelvic pain and continuing OCPs.   Pt plans to see GI 01/05/20 for persistent nausea over last year with heartburn. Follow up for back pain scheduled for 01/16/20.   Fleet Contras RN  12/22/19

## 2019-12-23 LAB — URINE CULTURE

## 2019-12-25 ENCOUNTER — Telehealth: Payer: Self-pay

## 2019-12-25 ENCOUNTER — Ambulatory Visit (INDEPENDENT_AMBULATORY_CARE_PROVIDER_SITE_OTHER): Payer: 59 | Admitting: Clinical

## 2019-12-25 ENCOUNTER — Other Ambulatory Visit: Payer: Self-pay

## 2019-12-25 ENCOUNTER — Other Ambulatory Visit: Payer: Self-pay | Admitting: Family Medicine

## 2019-12-25 DIAGNOSIS — F419 Anxiety disorder, unspecified: Secondary | ICD-10-CM

## 2019-12-25 LAB — CERVICOVAGINAL ANCILLARY ONLY
Bacterial Vaginitis (gardnerella): NEGATIVE
Candida Glabrata: NEGATIVE
Candida Vaginitis: NEGATIVE
Chlamydia: NEGATIVE
Comment: NEGATIVE
Comment: NEGATIVE
Comment: NEGATIVE
Comment: NEGATIVE
Comment: NEGATIVE
Comment: NORMAL
Neisseria Gonorrhea: NEGATIVE
Trichomonas: NEGATIVE

## 2019-12-25 MED ORDER — HYDROXYZINE PAMOATE 50 MG PO CAPS: 50.0000 mg | ORAL_CAPSULE | Freq: Three times a day (TID) | ORAL | 2 refills | Status: DC | PRN
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
## 2019-12-25 NOTE — Telephone Encounter (Signed)
Pt requested for someone to give her a call back.  L/M for pt that I giving her a call if she continues to have questions or concerns to please give the office a call.   Addison Naegeli, RN  12/25/19

## 2019-12-27 NOTE — Progress Notes (Signed)
Patient was assessed and managed by nursing staff during this encounter. I have reviewed the chart and agree with the documentation and plan. I have also made any necessary editorial changes.  Catalina Antigua, MD 12/27/2019 5:41 PM

## 2019-12-28 ENCOUNTER — Other Ambulatory Visit: Payer: 59

## 2019-12-29 ENCOUNTER — Ambulatory Visit: Admission: RE | Admit: 2019-12-29 | Payer: 59 | Source: Ambulatory Visit

## 2020-01-03 ENCOUNTER — Ambulatory Visit
Admission: RE | Admit: 2020-01-03 | Discharge: 2020-01-03 | Disposition: A | Discharge status: Home / Self Care | Payer: 59 | Source: Ambulatory Visit | Attending: Obstetrics & Gynecology | Admitting: Obstetrics & Gynecology

## 2020-01-03 ENCOUNTER — Other Ambulatory Visit: Payer: Self-pay

## 2020-01-03 DIAGNOSIS — R102 Pelvic and perineal pain: Secondary | ICD-10-CM | POA: Diagnosis present

## 2020-01-04 ENCOUNTER — Telehealth: Payer: Self-pay | Admitting: Obstetrics & Gynecology

## 2020-01-04 NOTE — Telephone Encounter (Signed)
Patient is requesting a call from the clinical staff about some results.

## 2020-01-04 NOTE — Telephone Encounter (Signed)
Returned patients call in regards to results. Patient did not answer. LM for patient to call the office for results.

## 2020-01-05 ENCOUNTER — Ambulatory Visit: Payer: Medicaid Other | Admitting: Physician Assistant

## 2020-01-10 ENCOUNTER — Ambulatory Visit: Payer: 59 | Admitting: Obstetrics & Gynecology

## 2020-01-12 ENCOUNTER — Other Ambulatory Visit: Payer: Self-pay | Admitting: Obstetrics & Gynecology

## 2020-01-16 ENCOUNTER — Other Ambulatory Visit: Payer: Medicaid Other

## 2020-01-16 ENCOUNTER — Other Ambulatory Visit: Payer: Self-pay | Admitting: Obstetrics & Gynecology

## 2020-01-16 ENCOUNTER — Other Ambulatory Visit: Payer: Self-pay | Admitting: Family Medicine

## 2020-01-16 DIAGNOSIS — N63 Unspecified lump in unspecified breast: Secondary | ICD-10-CM

## 2020-02-19 ENCOUNTER — Ambulatory Visit: Payer: 59 | Admitting: Obstetrics & Gynecology

## 2020-02-20 ENCOUNTER — Ambulatory Visit: Payer: 59 | Admitting: Physician Assistant

## 2020-02-20 ENCOUNTER — Telehealth: Payer: Self-pay | Admitting: Family Medicine

## 2020-02-20 NOTE — Telephone Encounter (Signed)
Patient calling to get her [place] results, also said she was told like a month ago that blood was in her urine she have questions about that

## 2020-02-22 NOTE — Telephone Encounter (Signed)
Called pt. Pt reports she was never contacted with results of pelvic US. Per chart review this [place] has not been reviewed by a provider. Explained to pt these results appear to be normal but I will send a message to Anyanwu, MD for review. Pt reports continued abdominal pain and foul odor to urine and vaginal discharge. Vaginal swab and urine culture both negative at nurse visit on 12/22/19. Per chart review plan of care was for pt to have [place] and follow up with provider appt. Pt no showed provider appt on 02/19/20 with Debroah Loop, MD. Instructed pt to call front office to schedule visit with provider for annual exam with evaluation for pelvic pain and vaginal discharge. Pt agreeable to plan. Message sent to [facility], MD with request to review [place].  Per chart review pt did not keep appt for GI evaluation. Encouraged pt to reschedule this appt as her issues may not be GYN related.  Fleet Contras RN 02/22/20

## 2020-02-23 NOTE — Telephone Encounter (Signed)
Message received this morning from Anyanwu, MD stating "Normal. No etiology seen for her pain." Called pt to review provider's comment. Pt verbalizes understanding and states she has a follow up appt for Tuesday.

## 2020-02-27 ENCOUNTER — Ambulatory Visit: Payer: 59 | Admitting: Student

## 2020-03-06 ENCOUNTER — Ambulatory Visit: Payer: 59 | Admitting: Obstetrics and Gynecology

## 2020-03-14 ENCOUNTER — Encounter: Payer: Self-pay | Admitting: Physician Assistant

## 2020-03-14 ENCOUNTER — Ambulatory Visit (INDEPENDENT_AMBULATORY_CARE_PROVIDER_SITE_OTHER): Payer: Medicaid Other | Admitting: Physician Assistant

## 2020-03-14 VITALS — BP 116/60 | HR 84 | Ht 62.6 in | Wt 164.0 lb

## 2020-03-14 DIAGNOSIS — R194 Change in bowel habit: Secondary | ICD-10-CM

## 2020-03-14 DIAGNOSIS — R110 Nausea: Secondary | ICD-10-CM | POA: Diagnosis not present

## 2020-03-14 DIAGNOSIS — R1030 Lower abdominal pain, unspecified: Secondary | ICD-10-CM

## 2020-03-14 DIAGNOSIS — K5900 Constipation, unspecified: Secondary | ICD-10-CM | POA: Diagnosis not present

## 2020-03-14 MED ORDER — OMEPRAZOLE 20 MG PO CPDR
20.0000 mg | DELAYED_RELEASE_CAPSULE | Freq: Every day | ORAL | 2 refills | Status: DC
Start: 2020-03-14 — End: 2020-10-17

## 2020-03-14 NOTE — Patient Instructions (Signed)
If you are age 36 or older, your body mass index should be between 23-30. Your Body mass index is 29.43 kg/m. If this is out of the aforementioned range listed, please consider follow up with your Primary Care Provider.  If you are age 25 or younger, your body mass index should be between 19-25. Your Body mass index is 29.43 kg/m. If this is out of the aformentioned range listed, please consider follow up with your Primary Care Provider.   We have sent the following medications to your pharmacy for you to pick up at your convenience: Omeprazole 20 mg daily 30-60 minutes before breakfast.  Start Miralax daily.
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
## 2020-03-18 NOTE — Progress Notes (Signed)
Agree with the assessment and plan as outlined by Jennifer Lemmon, PA-C. ? ?Ceonna Frazzini, DO, FACG ? ?

## 2020-03-21 ENCOUNTER — Ambulatory Visit: Payer: 59 | Admitting: Obstetrics & Gynecology

## 2020-04-22 ENCOUNTER — Ambulatory Visit: Payer: Self-pay | Admitting: Physician Assistant

## 2020-05-07 ENCOUNTER — Ambulatory Visit: Payer: Medicaid Other | Admitting: Family Medicine

## 2020-05-20 ENCOUNTER — Encounter (HOSPITAL_COMMUNITY): Payer: Self-pay

## 2020-05-20 ENCOUNTER — Other Ambulatory Visit: Payer: Self-pay

## 2020-05-20 ENCOUNTER — Ambulatory Visit (HOSPITAL_COMMUNITY)
Admission: EM | Admit: 2020-05-20 | Discharge: 2020-05-20 | Disposition: A | Discharge status: Home / Self Care | Payer: Medicaid Other | Attending: Family Medicine | Admitting: Family Medicine

## 2020-05-20 DIAGNOSIS — B349 Viral infection, unspecified: Secondary | ICD-10-CM

## 2020-05-20 DIAGNOSIS — R519 Headache, unspecified: Secondary | ICD-10-CM | POA: Diagnosis present

## 2020-05-20 DIAGNOSIS — R1032 Left lower quadrant pain: Secondary | ICD-10-CM | POA: Diagnosis present

## 2020-05-20 DIAGNOSIS — L709 Acne, unspecified: Secondary | ICD-10-CM | POA: Insufficient documentation

## 2020-05-20 DIAGNOSIS — U071 COVID-19: Secondary | ICD-10-CM | POA: Insufficient documentation

## 2020-05-20 LAB — POCT URINALYSIS DIPSTICK, ED / UC
Bilirubin Urine: NEGATIVE
Glucose, UA: NEGATIVE mg/dL
Hgb urine dipstick: NEGATIVE
Ketones, ur: NEGATIVE mg/dL
Leukocytes,Ua: NEGATIVE
Nitrite: NEGATIVE
Protein, ur: NEGATIVE mg/dL
Specific Gravity, Urine: 1.02 (ref 1.005–1.030)
Urobilinogen, UA: 0.2 mg/dL (ref 0.0–1.0)
pH: 5.5 (ref 5.0–8.0)

## 2020-05-20 LAB — POCT RAPID STREP A, ED / UC: Streptococcus, Group A Screen (Direct): NEGATIVE

## 2020-05-20 LAB — POC URINE PREG, ED: Preg Test, Ur: NEGATIVE

## 2020-05-20 NOTE — ED Provider Notes (Signed)
[facility]    CSN: [id number] Arrival date & time: 05/20/20  1646      History   Chief Complaint Chief Complaint  Patient presents with  . Abdominal Pain  . Back Pain    HPI Jill Burke is a 35 y.o. female.   HPI   Patient states she has been having headaches for 4 months.  I explained to her that this is a problem that needs to be followed by her primary care doctor She also complains of a rash on her face on her face breaking out.  This is not the reason for being here so she is also advised to see her family doctor about this problem She states she is having some abdominal pain and would like to have an ultrasound and a blood pregnancy test to rule out ectopic pregnancy.  I told her if she feels like she has an ectopic pregnancy she needs to go to the emergency room.  Urine pregnancy testing is negative She has a sore throat, congestion, and some coughing.  She is a smoker.  She has a son with strep.  She would like strep testing.  She does not think she needs a COVID test because she had a negative test December 22.  She is not COVID vaccinated.  We agreed to do a strep test first and if negative then she will agree to COVID testing  Past Medical History:  Diagnosis Date  . Anxiety   . Chlamydia 2007   treated  . Lump, breast   . Panic attack     Patient Active Problem List   Diagnosis Date Noted  . Panic attacks 09/12/2019  . Left breast lump 06/21/2012  . Breast pain, right 06/21/2012    Past Surgical History:  Procedure Laterality Date  . ECTOPIC PREGNANCY SHOTS      OB History    Gravida  4   Para  3   Term  3   Preterm      AB      Living  3     SAB      IAB      Ectopic      Multiple      Live Births               Home Medications    Prior to Admission medications   Medication Sig Start Date End Date Taking? Authorizing Provider  Levonorgestrel-Ethinyl Estradiol (ASHLYNA) 0.15-0.03 &0.01 MG tablet Take 1  tablet by mouth daily. 10/31/19   [provider]  omeprazole (PRILOSEC) 20 MG capsule Take 1 capsule (20 mg total) by mouth daily. 03/14/20   Unk Lightning, PA    Family History Family History  Problem Relation Age of Onset  . Hypertension Father   . Heart disease Father   . Hyperlipidemia Father   . Stroke Father   . Heart attack Father   . Diabetes Paternal Grandmother   . COPD Mother   . Cancer Maternal Grandmother   . Liver disease Maternal Grandfather        alcoholic    Social History Social History   Tobacco Use  . Smoking status: Current Every Day Smoker    Packs/day: 0.50    Years: 3.00    Pack years: 1.50    Types: Cigarettes  . Smokeless tobacco: Never Used  Vaping Use  . Vaping Use: Never used  Substance Use Topics  . Alcohol use: No  Alcohol/week: 0.0 standard drinks  . Drug use: Yes    Types: Marijuana     Allergies   Latex and Soap   Review of Systems Review of Systems  See HPI Physical Exam Triage Vital Signs ED Triage Vitals  Enc Vitals Group     BP 05/20/20 1831 131/74     Pulse Rate 05/20/20 1829 84     Resp 05/20/20 1829 18     Temp 05/20/20 1829 98.9 F (37.2 C)     Temp Source 05/20/20 1829 Oral     SpO2 05/20/20 1829 98 %     Weight --      Height --      Head Circumference --      Peak Flow --      Pain Score 05/20/20 1827 8     Pain Loc --      Pain Edu? --      Excl. in GC? --    No data found.  Updated Vital Signs BP 131/74 (BP Location: Left Arm)   Pulse 85   Temp 98.9 F (37.2 C) (Oral)   Resp 18   LMP  (LMP Unknown) Comment: last true period was [facility] 2020, since then had 2 days of int bleeding   SpO2 98%     Physical Exam Constitutional:      General: She is not in acute distress.    Appearance: She is well-developed and well-nourished.     Comments: Overweight.  No acute distress  HENT:     Head: Normocephalic and atraumatic.     Right Ear: Tympanic membrane and ear canal normal.      Left Ear: Tympanic membrane and ear canal normal.     Nose: Nose normal. No congestion.     Mouth/Throat:     Mouth: Oropharynx is clear and moist. Mucous membranes are moist.     Pharynx: No oropharyngeal exudate or posterior oropharyngeal erythema.  Eyes:     Extraocular Movements: Extraocular movements intact.     Conjunctiva/sclera: Conjunctivae normal.     Pupils: Pupils are equal, round, and reactive to light.  Cardiovascular:     Rate and Rhythm: Normal rate and regular rhythm.     Heart sounds: Normal heart sounds.  Pulmonary:     Effort: Pulmonary effort is normal. No respiratory distress.     Breath sounds: Normal breath sounds.  Abdominal:     General: There is no distension.     Palpations: Abdomen is soft.     Comments: Mild tenderness to deep palpation in the left lower quadrant.  No guarding or rebound  Musculoskeletal:        General: No edema. Normal range of motion.     Cervical back: Normal range of motion.  Lymphadenopathy:     Cervical: No cervical adenopathy.  Skin:    General: Skin is warm and dry.  Neurological:     Mental Status: She is alert.  Psychiatric:        Behavior: Behavior normal.      UC Treatments / Results  Labs (all labs ordered are listed, but only abnormal results are displayed) Labs Reviewed  CULTURE, GROUP A STREP ([facility])  SARS CORONAVIRUS 2 (TAT 6-24 HRS)  POCT URINALYSIS DIPSTICK, ED / UC  POC URINE PREG, ED  POCT RAPID STREP A, ED / UC    EKG   Radiology No results found.  Procedures Procedures (including critical care time)  Medications Ordered in UC Medications -  No data to display  Initial Impression / Assessment and Plan / UC Course  I have reviewed the triage vital signs and the nursing notes.  Pertinent labs & imaging results that were available during my care of the patient were reviewed by me and considered in my medical decision making (see chart for details).     Urinalysis was negative Pregnancy  test was negative Strep test was negative, COVID test is collected  As she and the 2 children she brought in for sore throat will being discharged the mother demanded Truddie Hidden testing for all of them.  They did not have the sudden onset of fever chills or body aches with respiratory symptoms, consistent with flu.  In addition were not seeing a lot of flu in this area.  I told the mother that it was not appropriate to do flu testing. Final Clinical Impressions(s) / UC Diagnoses   Final diagnoses:  Viral illness  Bad headache  Left lower quadrant abdominal pain  Acne, unspecified acne type     Discharge Instructions     You need to see you PCP about the recurrent headaches You need to see your GYN about the abdominal pain, go to the ER if you become worse  The strep test is negative. Go home to rest Drink plenty of fluids Take Tylenol or ibuprofen for pain or fever You may take over-the-counter cough and cold medicines as needed You must quarantine at home until your test result is available You can check for your test result in MyChart     ED Prescriptions    None     PDMP not reviewed this encounter.   Eustace Moore, MD 05/20/20 Elisha Ponder

## 2020-05-20 NOTE — Discharge Instructions (Addendum)
You need to see you PCP about the recurrent headaches You need to see your GYN about the abdominal pain, go to the ER if you become worse  The strep test is negative. Go home to rest Drink plenty of fluids Take Tylenol or ibuprofen for pain or fever You may take over-the-counter cough and cold medicines as needed You must quarantine at home until your test result is available You can check for your test result in MyChart

## 2020-05-20 NOTE — ED Triage Notes (Addendum)
Pt in with c/o lower abdominal and back pain that has been going on for 5 days.  Pt has been taking tylenol and ibuprofen  Also c/o left ear pain, headaches and hot flashes Also c/o feeling like her heart is racing  Also requesting blood pregnancy test because she doesn't know when LMP was and states she has been feeling nauseous and her breast are tender. Had tubal pregnancy back in may.  Has taken at home test  Also c/o welts on her face that have occurred recently

## 2020-05-21 ENCOUNTER — Ambulatory Visit: Payer: Medicaid Other

## 2020-05-21 LAB — SARS CORONAVIRUS 2 (TAT 6-24 HRS): SARS Coronavirus 2: NEGATIVE

## 2020-05-23 LAB — CULTURE, GROUP A STREP (THRC)

## 2020-06-11 ENCOUNTER — Ambulatory Visit: Payer: Medicaid Other | Admitting: Family Medicine

## 2020-07-16 ENCOUNTER — Ambulatory Visit: Payer: Medicaid Other

## 2020-07-26 ENCOUNTER — Other Ambulatory Visit: Payer: Medicaid Other

## 2020-08-15 ENCOUNTER — Other Ambulatory Visit: Payer: Medicaid Other

## 2020-09-15 ENCOUNTER — Other Ambulatory Visit: Payer: Medicaid Other

## 2020-09-15 ENCOUNTER — Telehealth: Payer: Medicaid Other | Admitting: Family

## 2020-09-15 NOTE — Progress Notes (Signed)
Called patient and discussed we could not prescribe controlled medications through an Video visit.   Jannifer Rodney, FNP

## 2020-10-15 ENCOUNTER — Ambulatory Visit: Payer: Medicaid Other

## 2020-10-17 ENCOUNTER — Ambulatory Visit (INDEPENDENT_AMBULATORY_CARE_PROVIDER_SITE_OTHER): Payer: Medicaid Other

## 2020-10-17 ENCOUNTER — Other Ambulatory Visit: Payer: Self-pay

## 2020-10-17 ENCOUNTER — Other Ambulatory Visit (HOSPITAL_COMMUNITY)
Admission: RE | Admit: 2020-10-17 | Discharge: 2020-10-17 | Disposition: A | Discharge status: Home / Self Care | Payer: Medicaid Other | Source: Ambulatory Visit | Attending: Family Medicine | Admitting: Family Medicine

## 2020-10-17 VITALS — BP 117/81 | HR 85 | Ht 62.0 in | Wt 169.5 lb

## 2020-10-17 DIAGNOSIS — Z3202 Encounter for pregnancy test, result negative: Secondary | ICD-10-CM

## 2020-10-17 DIAGNOSIS — N898 Other specified noninflammatory disorders of vagina: Secondary | ICD-10-CM

## 2020-10-17 LAB — POCT PREGNANCY, URINE: Preg Test, Ur: NEGATIVE

## 2020-10-17 NOTE — Progress Notes (Signed)
Pt here today for UPT and self swab. Pt states having lower abd and back pain with nausea and vaginal discharge x 3 days. Pt denies any vaginal odor or any urinary sx. Pt has hx of ectopic pregnancy. Pt states has not taken home UPT. Would like to take one today while at visit. Pt has hx of BV.  Pt states took self off OCP x 2 months ago. Pt has had unprotected intercourse the last 2 months.   Self swab collected today. Pt advised results will take 24-48 hours and will see results in mychart and will be notified if needs further treatment.   Pt advised of UPT results of negative. Pt verbalized understanding and agreeable to plan of care. Ectopic precautions given.   Pt requesting annual exam appt. Will make at front office check out today.    Judeth Cornfield, RN

## 2020-10-18 LAB — CERVICOVAGINAL ANCILLARY ONLY
Bacterial Vaginitis (gardnerella): NEGATIVE
Candida Glabrata: NEGATIVE
Candida Vaginitis: POSITIVE — AB
Chlamydia: NEGATIVE
Comment: NEGATIVE
Comment: NEGATIVE
Comment: NEGATIVE
Comment: NEGATIVE
Comment: NEGATIVE
Comment: NORMAL
Neisseria Gonorrhea: NEGATIVE
Trichomonas: NEGATIVE

## 2020-10-18 NOTE — Progress Notes (Signed)
Patient was assessed and managed by nursing staff during this encounter. I have reviewed the chart and agree with the documentation and plan.   Bernerd Limbo, CNM 10/18/2020 8:27 PM

## 2020-10-19 ENCOUNTER — Other Ambulatory Visit: Payer: Medicaid Other

## 2020-10-21 ENCOUNTER — Telehealth: Payer: Self-pay

## 2020-10-21 NOTE — Telephone Encounter (Signed)
Spoke with Paulina Fusi, CNM about pt's concerns and symptoms. Pt to come in tomorrow morning and drop off urine for culture before treating. Pt advised to also use OTC AZO for urinary symptoms and Tylenol or Ibuprofen for relief.  Pt to return call to office or send mychart message.   Judeth Cornfield, RN

## 2020-10-21 NOTE — Telephone Encounter (Signed)
Spoke with pt. Pt called today to say that she has seen results from 6/9 swab and would like medication. Also states having urinary symptoms of frequency, urgency, lower back pain and small amount of urtethral bleeding x 2 today when wiping. Pt is having small amounts of urine each time with bathroom trips.  Had UPT on 6/9 that was negative. Did not state having any UTI sx with nurse visit on 6/9.  Pt states does not have hx of kidney stones. Had UTI last year.   Will speak with Tyler Aas, CNM and return call to pt. Pt agreeable and verbalized understanding.   Judeth Cornfield, RN

## 2020-10-23 ENCOUNTER — Telehealth: Payer: Self-pay

## 2020-10-23 DIAGNOSIS — B379 Candidiasis, unspecified: Secondary | ICD-10-CM

## 2020-10-23 MED ORDER — FLUCONAZOLE 150 MG PO TABS
150.0000 mg | ORAL_TABLET | Freq: Once | ORAL | 0 refills | Status: AC
Start: 2020-10-23 — End: 2020-10-23

## 2020-10-23 NOTE — Telephone Encounter (Signed)
Pt called front office and requested to speak with nurse. Called pt back. Pt reports positive yeast infection result without treatment. Diflucan 150 mg sent to pt's pharmacy. Pt also reports UTI symptoms. Pt available to come into office for nurse visit tomorrow at 1040.

## 2020-10-24 ENCOUNTER — Other Ambulatory Visit: Payer: Self-pay

## 2020-10-24 ENCOUNTER — Ambulatory Visit (INDEPENDENT_AMBULATORY_CARE_PROVIDER_SITE_OTHER): Payer: Medicaid Other

## 2020-10-24 VITALS — BP 122/71 | HR 76 | Temp 98.4°F

## 2020-10-24 DIAGNOSIS — R350 Frequency of micturition: Secondary | ICD-10-CM | POA: Diagnosis not present

## 2020-10-24 DIAGNOSIS — R3915 Urgency of urination: Secondary | ICD-10-CM | POA: Diagnosis not present

## 2020-10-24 DIAGNOSIS — B379 Candidiasis, unspecified: Secondary | ICD-10-CM

## 2020-10-24 DIAGNOSIS — R109 Unspecified abdominal pain: Secondary | ICD-10-CM | POA: Diagnosis not present

## 2020-10-24 LAB — POCT URINALYSIS DIP (DEVICE)
Bilirubin Urine: NEGATIVE
Glucose, UA: NEGATIVE mg/dL
Ketones, ur: NEGATIVE mg/dL
Leukocytes,Ua: NEGATIVE
Nitrite: NEGATIVE
Protein, ur: NEGATIVE mg/dL
Specific Gravity, Urine: 1.025 (ref 1.005–1.030)
Urobilinogen, UA: 0.2 mg/dL (ref 0.0–1.0)
pH: 5.5 (ref 5.0–8.0)

## 2020-10-24 MED ORDER — FLUCONAZOLE 150 MG PO TABS: 150.0000 mg | ORAL_TABLET | Freq: Once | ORAL | 0 refills | Status: AC

## 2020-10-24 MED ORDER — PHENAZOPYRIDINE HCL 100 MG PO TABS: 100.0000 mg | ORAL_TABLET | Freq: Two times a day (BID) | ORAL | 0 refills | Status: DC

## 2020-10-24 MED ORDER — IBUPROFEN 800 MG PO TABS: 800.0000 mg | ORAL_TABLET | Freq: Three times a day (TID) | ORAL | 1 refills | Status: DC | PRN

## 2020-10-24 MED ORDER — CEPHALEXIN 500 MG PO CAPS: 500.0000 mg | ORAL_CAPSULE | Freq: Three times a day (TID) | ORAL | 0 refills | Status: DC

## 2020-10-24 NOTE — Progress Notes (Signed)
Here today with complaint of continued pelvic pain and flank pain since nurse visit last week. Reports feelings of urinary urgency and frequency. Reports yeast infection is resolving. UA shows small hemoglobin. Reviewed with Alysia Penna, MD who recommends urine culture.  Verbal orders received for the following: - 100 pyridium BID 7 days - Keflex 500 TID 7 - Motrin 800 TID 30 1 refill  If no improvement by Saturday, 10/26/20, Alysia Penna, MD recommends patient present to ED for evaluation for possible kidney stones. Pt requests rx for Diflucan because she usually develops a yeast infection while taking antibiotics. Pt took Diflucan yesterday, encouraged pt to not take another Diflucan until Sunday.  Fleet Contras RN 10/24/20

## 2020-10-25 ENCOUNTER — Other Ambulatory Visit: Payer: Self-pay

## 2020-10-25 DIAGNOSIS — R3915 Urgency of urination: Secondary | ICD-10-CM

## 2020-10-25 DIAGNOSIS — R109 Unspecified abdominal pain: Secondary | ICD-10-CM

## 2020-10-25 DIAGNOSIS — R350 Frequency of micturition: Secondary | ICD-10-CM

## 2020-10-25 NOTE — Telephone Encounter (Signed)
Error

## 2020-10-25 NOTE — Progress Notes (Signed)
Agree with A & P. 

## 2020-10-27 LAB — URINE CULTURE

## 2020-10-28 ENCOUNTER — Telehealth: Payer: Self-pay | Admitting: Family Medicine

## 2020-10-28 MED ORDER — CEPHALEXIN 500 MG PO CAPS: 500.0000 mg | ORAL_CAPSULE | Freq: Three times a day (TID) | ORAL | 0 refills | Status: DC

## 2020-11-04 ENCOUNTER — Ambulatory Visit: Payer: Medicaid Other | Admitting: Family Medicine

## 2020-11-14 ENCOUNTER — Telehealth: Payer: Self-pay

## 2020-11-14 NOTE — Telephone Encounter (Signed)
Pt called front office stating she was supposed to be scheduled for appt following negative test results.   Per chart review, pt tested negative for UTI recently, but was advised to complete medications due to symptoms. Called pt back; VM left stating I am returning pt's phone call and encouraged pt to call back.

## 2020-11-20 ENCOUNTER — Other Ambulatory Visit: Payer: Self-pay

## 2020-11-20 ENCOUNTER — Inpatient Hospital Stay (HOSPITAL_COMMUNITY)
Admission: AD | Admit: 2020-11-20 | Discharge: 2020-11-20 | Discharge status: Against Medical Advice | Payer: Medicaid Other | Attending: Obstetrics & Gynecology | Admitting: Obstetrics & Gynecology

## 2020-11-20 DIAGNOSIS — R109 Unspecified abdominal pain: Secondary | ICD-10-CM | POA: Diagnosis present

## 2020-11-20 DIAGNOSIS — M549 Dorsalgia, unspecified: Secondary | ICD-10-CM | POA: Insufficient documentation

## 2020-11-20 DIAGNOSIS — F17210 Nicotine dependence, cigarettes, uncomplicated: Secondary | ICD-10-CM | POA: Diagnosis not present

## 2020-11-20 DIAGNOSIS — R102 Pelvic and perineal pain: Secondary | ICD-10-CM | POA: Insufficient documentation

## 2020-11-20 DIAGNOSIS — Z3202 Encounter for pregnancy test, result negative: Secondary | ICD-10-CM | POA: Diagnosis not present

## 2020-11-20 LAB — CBC
HCT: 45.5 % (ref 36.0–46.0)
Hemoglobin: 15.7 g/dL — ABNORMAL HIGH (ref 12.0–15.0)
MCH: 32.8 pg (ref 26.0–34.0)
MCHC: 34.5 g/dL (ref 30.0–36.0)
MCV: 95 fL (ref 80.0–100.0)
Platelets: 280 10*3/uL (ref 150–400)
RBC: 4.79 MIL/uL (ref 3.87–5.11)
RDW: 12.7 % (ref 11.5–15.5)
WBC: 9.2 10*3/uL (ref 4.0–10.5)
nRBC: 0 % (ref 0.0–0.2)

## 2020-11-20 LAB — POCT PREGNANCY, URINE: Preg Test, Ur: NEGATIVE

## 2020-11-20 LAB — URINALYSIS, COMPLETE (UACMP) WITH MICROSCOPIC
Bacteria, UA: NONE SEEN
Bilirubin Urine: NEGATIVE
Glucose, UA: NEGATIVE mg/dL
Ketones, ur: NEGATIVE mg/dL
Leukocytes,Ua: NEGATIVE
Nitrite: NEGATIVE
Protein, ur: NEGATIVE mg/dL
Specific Gravity, Urine: 1.005 (ref 1.005–1.030)
pH: 6 (ref 5.0–8.0)

## 2020-11-20 LAB — COMPREHENSIVE METABOLIC PANEL
ALT: 13 U/L (ref 0–44)
AST: 13 U/L — ABNORMAL LOW (ref 15–41)
Albumin: 4.4 g/dL (ref 3.5–5.0)
Alkaline Phosphatase: 55 U/L (ref 38–126)
Anion gap: 9 (ref 5–15)
BUN: 7 mg/dL (ref 6–20)
CO2: 24 mmol/L (ref 22–32)
Calcium: 9.6 mg/dL (ref 8.9–10.3)
Chloride: 105 mmol/L (ref 98–111)
Creatinine, Ser: 0.79 mg/dL (ref 0.44–1.00)
GFR, Estimated: >60 mL/min (ref >60)
Glucose, Bld: 95 mg/dL (ref 70–99)
Potassium: 3.8 mmol/L (ref 3.5–5.1)
Sodium: 138 mmol/L (ref 135–145)
Total Bilirubin: 0.7 mg/dL (ref 0.3–1.2)
Total Protein: 6.8 g/dL (ref 6.5–8.1)

## 2020-11-20 LAB — HCG, QUANTITATIVE, PREGNANCY: hCG, Beta Chain, Quant, S: <1 m[IU]/mL (ref <5)

## 2020-11-20 NOTE — MAU Provider Note (Signed)
Unable to get into other note.  Results for orders placed or performed during the hospital encounter of 11/20/20 (from the past 24 hour(s))  Pregnancy, urine POC     Status: None   Collection Time: 11/20/20  4:52 PM  Result Value Ref Range   Preg Test, Ur NEGATIVE NEGATIVE  CBC     Status: Abnormal   Collection Time: 11/20/20  6:24 PM  Result Value Ref Range   WBC 9.2 4.0 - 10.5 K/uL   RBC 4.79 3.87 - 5.11 MIL/uL   Hemoglobin 15.7 (H) 12.0 - 15.0 g/dL   HCT 11.6 43.5 - 39.1 %   MCV 95.0 80.0 - 100.0 fL   MCH 32.8 26.0 - 34.0 pg   MCHC 34.5 30.0 - 36.0 g/dL   RDW 22.5 83.4 - 62.1 %   Platelets 280 150 - 400 K/uL   nRBC 0.0 0.0 - 0.2 %  Comprehensive metabolic panel     Status: Abnormal   Collection Time: 11/20/20  6:24 PM  Result Value Ref Range   Sodium 138 135 - 145 mmol/L   Potassium 3.8 3.5 - 5.1 mmol/L   Chloride 105 98 - 111 mmol/L   CO2 24 22 - 32 mmol/L   Glucose, Bld 95 70 - 99 mg/dL   BUN 7 6 - 20 mg/dL   Creatinine, Ser 9.47 0.44 - 1.00 mg/dL   Calcium 9.6 8.9 - 12.5 mg/dL   Total Protein 6.8 6.5 - 8.1 g/dL   Albumin 4.4 3.5 - 5.0 g/dL   AST 13 (L) 15 - 41 U/L   ALT 13 0 - 44 U/L   Alkaline Phosphatase 55 38 - 126 U/L   Total Bilirubin 0.7 0.3 - 1.2 mg/dL   GFR, Estimated >27 >12 mL/min   Anion gap 9 5 - 15  hCG, quantitative, pregnancy     Status: None   Collection Time: 11/20/20  6:24 PM  Result Value Ref Range   hCG, Beta Chain, Quant, S <1 <5 mIU/mL  Urinalysis, Complete w Microscopic Urine, Clean Catch     Status: Abnormal   Collection Time: 11/20/20  6:30 PM  Result Value Ref Range   Color, Urine STRAW (A) YELLOW   APPearance CLEAR CLEAR   Specific Gravity, Urine 1.005 1.005 - 1.030   pH 6.0 5.0 - 8.0   Glucose, UA NEGATIVE NEGATIVE mg/dL   Hgb urine dipstick SMALL (A) NEGATIVE   Bilirubin Urine NEGATIVE NEGATIVE   Ketones, ur NEGATIVE NEGATIVE mg/dL   Protein, ur NEGATIVE NEGATIVE mg/dL   Nitrite NEGATIVE NEGATIVE   Leukocytes,Ua NEGATIVE  NEGATIVE   RBC / HPF 0-5 0 - 5 RBC/hpf   WBC, UA 0-5 0 - 5 WBC/hpf   Bacteria, UA NONE SEEN NONE SEEN   Squamous Epithelial / LPF 0-5 0 - 5   Patient left AMA HCG is negative Patient unavailable for results review  Aviva Signs, CNM

## 2020-11-20 NOTE — MAU Note (Signed)
Presents with c/o lower abdominal pain and right flank pain.  Reports had positive blood test for pregnancy at MD office.  LMP 11/01/2020, bleeding only lasted 2 days.

## 2020-11-20 NOTE — MAU Note (Signed)
2nd call. No answer. 

## 2020-11-20 NOTE — MAU Provider Note (Addendum)
History     CSN: [id number]  Arrival date and time: 11/20/20 1636   None     Chief Complaint  Patient presents with   Abdominal Pain   Flank Pain   Vaginal Discharge   HPI  Jill Burke is presenting for lower abdominal and back pain. Patient reports her abdominal pain started about 3 days ago. It is a sharp pain located in the mid-right lower abdomen. She is concerned this could be another ectopic pregnancy as she had a similar pain with her previous ectopic in 2021. She reports having a positive b-hcg at another medical facility in the past couple of weeks, however, she is unable to recall the name of the facility she went to. In her previous ectopic, she reported several negative urine pregnancy test results, and she is fearful this could be the case today. She affirms nausea but denies vomiting, abnormal vaginal discharge, or bleeding. She has also been having right-sided back pain for the past 3-4 weeks. On Sunday 7/10, she reported frank blood in her urine. She reports urinary frequency and urgency but denies dysuria. She notes her urine has had an odd odor to it but it has been clear in color.   She was seen at [facility] on 6/9 and a UPT and self swabs were performed. UPT was negative and swab was positive for candida infection. She was given Diflucan. On 6/16, [facility] pelvic/flank pain with urgency and frequency. UA revealed small hgb on dipstick and urine culture revealed <10,000 CFU. Patient was given Pyridium, Keflex, and Motrin. She was told if symptoms did not improve by 10/26/20 she was to report to the ED for evaluation of possible kidney stones. Patient received another round of Diflucan as she stated she typically develops a yeast infection while taking antibiotics.  OB History     Gravida  4   Para  3   Term  3   Preterm      AB      Living  3      SAB      IAB      Ectopic      Multiple      Live Births               Past Medical History:  Diagnosis Date   Anxiety    Chlamydia 2007   treated   Lump, breast    Panic attack     Past Surgical History:  Procedure Laterality Date   ECTOPIC PREGNANCY SHOTS      Family History  Problem Relation Age of Onset   Hypertension Father    Heart disease Father    Hyperlipidemia Father    Stroke Father    Heart attack Father    Diabetes Paternal Grandmother    COPD Mother    Cancer Maternal Grandmother    Liver disease Maternal Grandfather        alcoholic    Social History   Tobacco Use   Smoking status: Every Day    Packs/day: 0.50    Years: 3.00    Pack years: 1.50    Types: Cigarettes   Smokeless tobacco: Never  Vaping Use   Vaping Use: Never used  Substance Use Topics   Alcohol use: No    Alcohol/week: 0.0 standard drinks   Drug use: Yes    Types: Marijuana    Allergies:  Allergies  Allergen Reactions   Latex Dermatitis   Soap  Dermatitis    Tide Clothes Soap Specifically    Medications Prior to Admission  Medication Sig Dispense Refill Last Dose   cephALEXin (KEFLEX) 500 MG capsule Take 1 capsule (500 mg total) by mouth 3 (three) times daily. 21 capsule 0    ibuprofen (ADVIL) 800 MG tablet Take 1 tablet (800 mg total) by mouth every 8 (eight) hours as needed. 30 tablet 1    phenazopyridine (PYRIDIUM) 100 MG tablet Take 1 tablet (100 mg total) by mouth in the morning and at bedtime. 14 tablet 0     Review of Systems  Gastrointestinal:  Positive for abdominal pain (right lower quadrant) and nausea. Negative for vomiting.  Genitourinary:  Positive for frequency, hematuria (Sunday 7/10) and urgency. Negative for decreased urine volume, dysuria, vaginal bleeding and vaginal discharge.  Musculoskeletal:  Positive for back pain (right sided).   Physical Exam   Blood pressure 116/86, pulse 75, temperature 98.2 F (36.8 C), temperature source Oral, resp. rate 20, height 5' 2.5" (1.588 m), weight 75.6 kg, SpO2 99  %.  Physical Exam Constitutional:      Appearance: She is well-developed.  HENT:     Head: Normocephalic and atraumatic.  Pulmonary:     Effort: Pulmonary effort is normal.  Abdominal:     Tenderness: There is no right CVA tenderness or left CVA tenderness.  Neurological:     Mental Status: She is alert and oriented to person, place, and time.  Psychiatric:        Mood and Affect: Mood normal.        Behavior: Behavior normal.   Results for orders placed or performed during the hospital encounter of 11/20/20 (from the past 24 hour(s))  Pregnancy, urine POC     Status: None   Collection Time: 11/20/20  4:52 PM  Result Value Ref Range   Preg Test, Ur NEGATIVE NEGATIVE  CBC     Status: Abnormal   Collection Time: 11/20/20  6:24 PM  Result Value Ref Range   WBC 9.2 4.0 - 10.5 K/uL   RBC 4.79 3.87 - 5.11 MIL/uL   Hemoglobin 15.7 (H) 12.0 - 15.0 g/dL   HCT 02.7 74.1 - 28.7 %   MCV 95.0 80.0 - 100.0 fL   MCH 32.8 26.0 - 34.0 pg   MCHC 34.5 30.0 - 36.0 g/dL   RDW 86.7 67.2 - 09.4 %   Platelets 280 150 - 400 K/uL   nRBC 0.0 0.0 - 0.2 %  Comprehensive metabolic panel     Status: Abnormal   Collection Time: 11/20/20  6:24 PM  Result Value Ref Range   Sodium 138 135 - 145 mmol/L   Potassium 3.8 3.5 - 5.1 mmol/L   Chloride 105 98 - 111 mmol/L   CO2 24 22 - 32 mmol/L   Glucose, Bld 95 70 - 99 mg/dL   BUN 7 6 - 20 mg/dL   Creatinine, Ser 7.09 0.44 - 1.00 mg/dL   Calcium 9.6 8.9 - 62.8 mg/dL   Total Protein 6.8 6.5 - 8.1 g/dL   Albumin 4.4 3.5 - 5.0 g/dL   AST 13 (L) 15 - 41 U/L   ALT 13 0 - 44 U/L   Alkaline Phosphatase 55 38 - 126 U/L   Total Bilirubin 0.7 0.3 - 1.2 mg/dL   GFR, Estimated >36 >62 mL/min   Anion gap 9 5 - 15  Urinalysis, Complete w Microscopic Urine, Clean Catch     Status: Abnormal   Collection Time: 11/20/20  6:30 PM  Result Value Ref Range   Color, Urine STRAW (A) YELLOW   APPearance CLEAR CLEAR   Specific Gravity, Urine 1.005 1.005 - 1.030   pH 6.0  5.0 - 8.0   Glucose, UA NEGATIVE NEGATIVE mg/dL   Hgb urine dipstick SMALL (A) NEGATIVE   Bilirubin Urine NEGATIVE NEGATIVE   Ketones, ur NEGATIVE NEGATIVE mg/dL   Protein, ur NEGATIVE NEGATIVE mg/dL   Nitrite NEGATIVE NEGATIVE   Leukocytes,Ua NEGATIVE NEGATIVE   RBC / HPF 0-5 0 - 5 RBC/hpf   WBC, UA 0-5 0 - 5 WBC/hpf   Bacteria, UA NONE SEEN NONE SEEN   Squamous Epithelial / LPF 0-5 0 - 5    MAU Course  Procedures  MDM Urine pregnancy: negative  hCG, quantitative: pending  CBC: WBC count WNL, Hgb slightly elevated (15.7) CMP: WNL  UA: no leukocytes, nitrite, or bacteria; small hgb   Assessment and Plan   At 8:08 PM went to call the patient from the lobby and patient was not there. Patient left prior to receiving results or recommendations for treatment.   Athena Masse 11/20/2020, 7:59 PM   CNM attestation:  I have seen and examined this patient and agree with above documentation in the PA student's note.   Jill Burke is a 35 y.o. [phone] at Unknown reporting pain in her lower back, pain over her suprapubic area. She reports that she had a positive blood test at an outside lab, but she cannot remember the name. With her pain, she is concerned that she could have a kidney infection or an ectopic pregnancy.    PE: Patient Vitals for the past 24 hrs:  BP Temp Temp src Pulse Resp SpO2 Height Weight  11/20/20 1729 116/86 98.2 F (36.8 C) Oral 75 20 99 % -- --  11/20/20 1720 -- -- -- -- -- -- 5' 2.5" (1.588 m) 75.6 kg   Gen: calm comfortable, NAD Resp: normal effort, no distress Heart: Regular rate No CVA tenderness, patient is very comfortable, walking around the unit and talking.    Orders Placed This Encounter  Procedures   CBC   Comprehensive metabolic panel   hCG, quantitative, pregnancy   Urinalysis, Complete w Microscopic Urine, Unspecified Source   Pregnancy, urine POC   No orders of the defined types were placed in this encounter.   MDM -UA sent,  no signs of infection -bHCG is pending  Assessment: No diagnosis found.  -patient left (did not sign AMA papers) before UA and bhcg results were reviewed with patient; she has a follow-up appt in August at [facility].   Marylene Land, CNM 11/21/2020 10:39 AM

## 2020-11-20 NOTE — MAU Note (Signed)
Called patient from lobby. No answer 

## 2020-11-20 NOTE — MAU Note (Signed)
3rd call. No answer.

## 2020-11-21 ENCOUNTER — Telehealth: Payer: Self-pay | Admitting: Student

## 2020-11-21 NOTE — Telephone Encounter (Signed)
Telephone call to patient to discuss results; patient did not answer. HIPAA compliant vm left, encouraged patient to review results on My Chart and to keep her upcoming appts.   Luna Kitchens

## 2020-11-28 ENCOUNTER — Other Ambulatory Visit: Payer: Self-pay

## 2020-11-28 ENCOUNTER — Ambulatory Visit (INDEPENDENT_AMBULATORY_CARE_PROVIDER_SITE_OTHER): Payer: Medicaid Other | Admitting: General Practice

## 2020-11-28 ENCOUNTER — Other Ambulatory Visit (HOSPITAL_COMMUNITY)
Admission: RE | Admit: 2020-11-28 | Discharge: 2020-11-28 | Disposition: A | Discharge status: Home / Self Care | Payer: Medicaid Other | Source: Ambulatory Visit | Attending: Family Medicine | Admitting: Family Medicine

## 2020-11-28 VITALS — BP 110/79 | HR 78 | Ht 62.0 in | Wt 164.0 lb

## 2020-11-28 DIAGNOSIS — N898 Other specified noninflammatory disorders of vagina: Secondary | ICD-10-CM | POA: Insufficient documentation

## 2020-11-28 DIAGNOSIS — R102 Pelvic and perineal pain: Secondary | ICD-10-CM | POA: Insufficient documentation

## 2020-11-28 LAB — POCT URINALYSIS DIP (DEVICE)
Bilirubin Urine: NEGATIVE
Glucose, UA: NEGATIVE mg/dL
Ketones, ur: NEGATIVE mg/dL
Leukocytes,Ua: NEGATIVE
Nitrite: NEGATIVE
Protein, ur: NEGATIVE mg/dL
Specific Gravity, Urine: 1.025 (ref 1.005–1.030)
Urobilinogen, UA: 0.2 mg/dL (ref 0.0–1.0)
pH: 5.5 (ref 5.0–8.0)

## 2020-11-28 NOTE — Progress Notes (Signed)
Patient was assessed and managed by nursing staff during this encounter. I have reviewed the chart and agree with the documentation and plan. I have also made any necessary editorial changes.  Catalina Antigua, MD 11/28/2020 4:46 PM

## 2020-11-28 NOTE — Progress Notes (Signed)
Patient reports pelvic pressure/pain consistently throughout the day for the past month. Patient was seen a month ago for this and was treated for a UTI as well as a yeast infection. Patient hasn't seen improvement and in fact feels her pain/pressure has worsened. She denies dysuria or incomplete emptying. She also notes vaginal discharge with odor for past couple of days during intercourse and after as well as bleeding after intercourse. Patient is requesting UA as well as self swab testing for everything. Patient states she had an appt with urology but missed it. She plans to call them to reschedule. Patient was instructed in self swab & specimen was collected. UA reveals trace Hgb only. Advised results should be back in 24-48 hours in mychart & we will reach out to her with any abnormal results. Also advised she follow up with urology & rescheduled appt. Patient desires birth control at 8/25 office visit but has been having unprotected intercourse every day for the past 2 months. Recommended she use condoms between now and her appt and also advised pelvic rest for next several days as well. Patient verbalized understanding.  Chase Caller RN BSN 11/28/20

## 2020-11-29 ENCOUNTER — Other Ambulatory Visit: Payer: Medicaid Other

## 2020-11-29 ENCOUNTER — Inpatient Hospital Stay: Admission: RE | Admit: 2020-11-29 | Payer: Medicaid Other | Source: Ambulatory Visit

## 2020-11-29 LAB — CERVICOVAGINAL ANCILLARY ONLY
Bacterial Vaginitis (gardnerella): POSITIVE — AB
Candida Glabrata: NEGATIVE
Candida Vaginitis: POSITIVE — AB
Chlamydia: NEGATIVE
Comment: NEGATIVE
Comment: NEGATIVE
Comment: NEGATIVE
Comment: NEGATIVE
Comment: NEGATIVE
Comment: NORMAL
Neisseria Gonorrhea: NEGATIVE
Trichomonas: NEGATIVE

## 2020-12-02 ENCOUNTER — Other Ambulatory Visit: Payer: Self-pay | Admitting: Obstetrics and Gynecology

## 2020-12-02 MED ORDER — METRONIDAZOLE 500 MG PO TABS: 500.0000 mg | ORAL_TABLET | Freq: Two times a day (BID) | ORAL | 0 refills | Status: DC

## 2020-12-02 MED ORDER — FLUCONAZOLE 150 MG PO TABS: 150.0000 mg | ORAL_TABLET | Freq: Once | ORAL | 0 refills | Status: AC

## 2020-12-04 ENCOUNTER — Other Ambulatory Visit: Payer: Self-pay | Admitting: Family Medicine

## 2020-12-04 DIAGNOSIS — N63 Unspecified lump in unspecified breast: Secondary | ICD-10-CM

## 2020-12-10 ENCOUNTER — Telehealth: Payer: Self-pay | Admitting: *Deleted

## 2020-12-10 MED ORDER — FLUCONAZOLE 150 MG PO TABS: 150.0000 mg | ORAL_TABLET | Freq: Once | ORAL | 1 refills | Status: AC

## 2020-12-10 NOTE — Telephone Encounter (Signed)
Pt left VM message stating that she received medication on 7/25 for treatment of vaginal yeast infection. She stated that in the past, she has been given 3 tablets of the medication and this time only received one which has not resolved the problem. Per chart review, pt was also prescribed Metronidazole for BV. Consult with Dr. Crissie Reese who approved pt's request. I contacted pt and informed her that I will send in refill Rx as requested. She should contact our office if symptoms do not improve. She voiced understanding.

## 2021-01-02 ENCOUNTER — Other Ambulatory Visit (HOSPITAL_COMMUNITY)
Admission: RE | Admit: 2021-01-02 | Discharge: 2021-01-02 | Disposition: A | Discharge status: Home / Self Care | Payer: Medicaid Other | Source: Ambulatory Visit | Attending: Family Medicine | Admitting: Family Medicine

## 2021-01-02 ENCOUNTER — Ambulatory Visit (INDEPENDENT_AMBULATORY_CARE_PROVIDER_SITE_OTHER): Payer: Medicaid Other | Admitting: Family Medicine

## 2021-01-02 ENCOUNTER — Other Ambulatory Visit: Payer: Self-pay

## 2021-01-02 VITALS — BP 119/82 | HR 74 | Wt 162.4 lb

## 2021-01-02 DIAGNOSIS — F410 Panic disorder [episodic paroxysmal anxiety] without agoraphobia: Secondary | ICD-10-CM | POA: Diagnosis not present

## 2021-01-02 DIAGNOSIS — Z113 Encounter for screening for infections with a predominantly sexual mode of transmission: Secondary | ICD-10-CM

## 2021-01-02 DIAGNOSIS — N760 Acute vaginitis: Secondary | ICD-10-CM | POA: Diagnosis not present

## 2021-01-02 DIAGNOSIS — Z124 Encounter for screening for malignant neoplasm of cervix: Secondary | ICD-10-CM

## 2021-01-02 DIAGNOSIS — B9689 Other specified bacterial agents as the cause of diseases classified elsewhere: Secondary | ICD-10-CM

## 2021-01-02 DIAGNOSIS — Z01419 Encounter for gynecological examination (general) (routine) without abnormal findings: Secondary | ICD-10-CM

## 2021-01-02 DIAGNOSIS — Z30011 Encounter for initial prescription of contraceptive pills: Secondary | ICD-10-CM

## 2021-01-02 LAB — POCT PREGNANCY, URINE: Preg Test, Ur: NEGATIVE

## 2021-01-02 MED ORDER — METRONIDAZOLE 500 MG PO TABS: 500.0000 mg | ORAL_TABLET | Freq: Two times a day (BID) | ORAL | 0 refills | Status: DC

## 2021-01-02 MED ORDER — LORAZEPAM 1 MG PO TABS: ORAL_TABLET | ORAL | 0 refills | Status: DC

## 2021-01-02 MED ORDER — FLUCONAZOLE 150 MG PO TABS: 150.0000 mg | ORAL_TABLET | Freq: Once | ORAL | 3 refills | Status: AC

## 2021-01-02 MED ORDER — NORETHINDRONE 0.35 MG PO TABS: 1.0000 | ORAL_TABLET | Freq: Every day | ORAL | 12 refills | Status: DC

## 2021-01-02 NOTE — Progress Notes (Signed)
Patient is here for full annual exam. She would like full STI screening along with pap smear

## 2021-01-02 NOTE — Progress Notes (Signed)
GYNECOLOGY ANNUAL PREVENTATIVE CARE ENCOUNTER NOTE  Subjective:   Jill Burke is a 35 y.o. [phone] female here for a routine annual gynecologic exam.  Current complaints: here for birth control and regular physical. Desires STI screening. Reports irregular cycles since stopping OCP and multiple episodes of unprotected sex.   Denies abnormal vaginal bleeding, discharge, pelvic pain, problems with intercourse or other gynecologic concerns.    Gynecologic History Patient's last menstrual period was 12/30/2020 (exact date). Contraception: none Last Pap: 2016. Results were: normal Last mammogram: Needs  The following portions of the patient's history were reviewed and updated as appropriate: allergies, current medications, past family history, past medical history, past social history, past surgical history and problem list.  Review of Systems Pertinent items are noted in HPI.   Objective:  BP 119/82   Pulse 74   Wt 162 lb 6.4 oz (73.7 kg)   LMP 12/30/2020 (Exact Date)   BMI 29.70 kg/m  CONSTITUTIONAL: Well-developed, well-nourished female in no acute distress.  HENT:  Normocephalic, atraumatic, External right and left ear normal. Oropharynx is clear and moist EYES:  No scleral icterus.  NECK: Normal range of motion, supple, no masses.  Normal thyroid.  SKIN: Skin is warm and dry. No rash noted. Not diaphoretic. No erythema. No pallor. NEUROLOGIC: Alert and oriented to person, place, and time. Normal reflexes, muscle tone coordination. No cranial nerve deficit noted. PSYCHIATRIC: Normal mood and affect. Normal behavior. Normal judgment and thought content. CARDIOVASCULAR: Normal heart rate noted, regular rhythm. 2+ distal pulses. RESPIRATORY: Effort and breath sounds normal, no problems with respiration noted. BREASTS: Symmetric in size. No masses, skin changes, nipple drainage, or lymphadenopathy. ABDOMEN: Soft,  no distention noted.  No tenderness, rebound or guarding.  PELVIC:  Normal appearing external genitalia; normal appearing vaginal mucosa and cervix.  Blood in vault, expected due to menses. No abnormal discharge noted.  Pap smear obtained.  Normal uterine size, no other palpable masses, no uterine or adnexal tenderness. MUSCULOSKELETAL: Normal range of motion.     Assessment and Plan:  1) Annual gynecologic examination with pap smear:  Will follow up results of pap smear and manage accordingly. STI screen also ordered today.  Routine preventative health maintenance measures emphasized.  2) Contraception counseling: Reviewed all forms of birth control options available including abstinence; over the counter/barrier methods; hormonal contraceptive medication including pill, patch, ring, injection,contraceptive implant; hormonal and nonhormonal IUDs; permanent sterilization options including vasectomy and the various tubal sterilization modalities. Risks and benefits reviewed.  Questions were answered.  Written information was also given to the patient to review.  Patient desires progesterone only pill (wants a pill and is > 35 and smokes), this was prescribed for patient. She will follow up in  1 yr for surveillance.  She was told to call with any further questions, or with any concerns about this method of contraception.  Emphasized use of condoms 100% of the time for STI prevention.  1. Screening examination for venereal disease - RPR - HIV Antibody (routine testing w rflx) - Cytology - PAP( [place]) - Hepatitis C Antibody - Hepatitis B Surface AntiGEN - Cervicovaginal ancillary only( [place])  2. Well woman exam with routine gynecological exam - Pregnancy, urine POC  3. Cervical cancer screening Pap today  4. BV (bacterial vaginosis) - metroNIDAZOLE (FLAGYL) 500 MG tablet; Take 1 tablet (500 mg total) by mouth 2 (two) times daily.  Dispense: 14 tablet; Refill: 0 - fluconazole (DIFLUCAN) 150 MG tablet; Take 1 tablet (  150 mg total) by mouth once for  1 dose. Can take additional dose three days later if symptoms persist  Dispense: 1 tablet; Refill: 3  5. Encounter for initial prescription of contraceptive pills - norethindrone (MICRONOR) 0.35 MG tablet; Take 1 tablet (0.35 mg total) by mouth daily.  Dispense: 28 tablet; Refill: 12  6. Panic attacks Has upcoming imaging studies. Would like medication to help with exams. I have prescribed this per below.  - LORazepam (ATIVAN) 1 MG tablet; Take 1/2 to 1 tablet every 8 hours prn anxiety and prior to procedures  Dispense: 2 tablet; Refill:   Please refer to After Visit Summary for other counseling recommendations.   Return in about 1 year (around 01/02/2022) for Yearly wellness exam.  Federico Flake, MD, MPH, ABFM Attending Physician Center for [facility]

## 2021-01-03 LAB — HEPATITIS B SURFACE ANTIGEN: Hepatitis B Surface Ag: NEGATIVE

## 2021-01-03 LAB — HIV ANTIBODY (ROUTINE TESTING W REFLEX): HIV Screen 4th Generation wRfx: NONREACTIVE

## 2021-01-03 LAB — CERVICOVAGINAL ANCILLARY ONLY
Bacterial Vaginitis (gardnerella): POSITIVE — AB
Candida Glabrata: NEGATIVE
Candida Vaginitis: NEGATIVE
Chlamydia: NEGATIVE
Comment: NEGATIVE
Comment: NEGATIVE
Comment: NEGATIVE
Comment: NEGATIVE
Comment: NEGATIVE
Comment: NORMAL
Neisseria Gonorrhea: NEGATIVE
Trichomonas: NEGATIVE

## 2021-01-03 LAB — CYTOLOGY - PAP
Comment: NEGATIVE
Diagnosis: NEGATIVE
High risk HPV: NEGATIVE

## 2021-01-03 LAB — HEPATITIS C ANTIBODY: Hep C Virus Ab: <0.1 s/co ratio (ref 0.0–0.9)

## 2021-01-03 LAB — RPR: RPR Ser Ql: NONREACTIVE

## 2021-01-08 ENCOUNTER — Ambulatory Visit
Admission: RE | Admit: 2021-01-08 | Discharge: 2021-01-08 | Disposition: A | Discharge status: Home / Self Care | Payer: Medicaid Other | Source: Ambulatory Visit | Attending: Family Medicine | Admitting: Family Medicine

## 2021-01-08 ENCOUNTER — Other Ambulatory Visit: Payer: Self-pay | Admitting: Family Medicine

## 2021-01-08 ENCOUNTER — Other Ambulatory Visit: Payer: Self-pay

## 2021-01-08 DIAGNOSIS — N63 Unspecified lump in unspecified breast: Secondary | ICD-10-CM

## 2021-03-14 ENCOUNTER — Ambulatory Visit: Payer: Medicaid Other

## 2021-03-18 ENCOUNTER — Ambulatory Visit: Payer: Medicaid Other

## 2021-03-31 ENCOUNTER — Ambulatory Visit (INDEPENDENT_AMBULATORY_CARE_PROVIDER_SITE_OTHER): Payer: Medicaid Other

## 2021-03-31 ENCOUNTER — Other Ambulatory Visit: Payer: Self-pay

## 2021-03-31 ENCOUNTER — Other Ambulatory Visit (HOSPITAL_COMMUNITY)
Admission: RE | Admit: 2021-03-31 | Discharge: 2021-03-31 | Disposition: A | Discharge status: Home / Self Care | Payer: Medicaid Other | Source: Ambulatory Visit | Attending: Family Medicine | Admitting: Family Medicine

## 2021-03-31 VITALS — BP 115/62 | HR 70 | Wt 167.6 lb

## 2021-03-31 DIAGNOSIS — R350 Frequency of micturition: Secondary | ICD-10-CM

## 2021-03-31 DIAGNOSIS — N898 Other specified noninflammatory disorders of vagina: Secondary | ICD-10-CM | POA: Diagnosis present

## 2021-03-31 DIAGNOSIS — Z113 Encounter for screening for infections with a predominantly sexual mode of transmission: Secondary | ICD-10-CM | POA: Diagnosis not present

## 2021-03-31 LAB — POCT URINALYSIS DIP (DEVICE)
Bilirubin Urine: NEGATIVE
Glucose, UA: NEGATIVE mg/dL
Hgb urine dipstick: NEGATIVE
Ketones, ur: NEGATIVE mg/dL
Leukocytes,Ua: NEGATIVE
Nitrite: NEGATIVE
Protein, ur: NEGATIVE mg/dL
Specific Gravity, Urine: >=1.03 (ref 1.005–1.030)
Urobilinogen, UA: 0.2 mg/dL (ref 0.0–1.0)
pH: 6 (ref 5.0–8.0)

## 2021-03-31 NOTE — Progress Notes (Signed)
Patient seen today with complaint of urinary frequency and vaginal itching. UA results normal. Explained self swab procedure. We will call with abnormal results.   Alesia Richards RN  03/31/21

## 2021-04-01 LAB — CERVICOVAGINAL ANCILLARY ONLY
Bacterial Vaginitis (gardnerella): NEGATIVE
Candida Glabrata: NEGATIVE
Candida Vaginitis: NEGATIVE
Chlamydia: NEGATIVE
Comment: NEGATIVE
Comment: NEGATIVE
Comment: NEGATIVE
Comment: NEGATIVE
Comment: NEGATIVE
Comment: NORMAL
Neisseria Gonorrhea: NEGATIVE
Trichomonas: NEGATIVE

## 2021-04-20 NOTE — Progress Notes (Signed)
Patient was assessed and managed by nursing staff during this encounter. I have reviewed the chart and agree with the documentation and plan. I have also made any necessary editorial changes.  Odyn Turko, MD 04/20/2021 9:12 PM   

## 2021-05-15 ENCOUNTER — Ambulatory Visit: Payer: Self-pay | Admitting: Cardiology

## 2021-05-15 DIAGNOSIS — R002 Palpitations: Secondary | ICD-10-CM | POA: Insufficient documentation

## 2021-05-15 HISTORY — DX: Palpitations: R00.2

## 2021-05-15 NOTE — Progress Notes (Signed)
Error

## 2021-05-29 NOTE — Progress Notes (Signed)
Patient referred by Trey Sailors, PA for palpitations  Subjective:   Jill Burke, female    DOB: Jan 30, 1986, 36 y.o.   MRN: [id number]   Chief Complaint  Patient presents with   Palpitations   New Patient (Initial Visit)     HPI  36 y.o. 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 She has been a pack a day smoker, now down to 3 cigarettes/day and has set a quit date for next week. She has experienced palpitations and tachycardia at rest, worse with activity. She admits to not being physically active since she started her own business in 2020. She denies chest pain, shortness of breath, leg edema, orthopnea, PND, TIA/syncope.  She has made changes to her diet, after recent lipid panel. She has been prescribed a statin by her PCP, but has not started it yet.    Past Medical History:  Diagnosis Date   Anxiety    Chlamydia 2007   treated   Lump, breast    Panic attack      Past Surgical History:  Procedure Laterality Date   ECTOPIC PREGNANCY SHOTS       Social History   Tobacco Use  Smoking Status Every Day   Packs/day: 0.50   Years: 3.00   Pack years: 1.50   Types: Cigarettes  Smokeless Tobacco Never    Social History   Substance and Sexual Activity  Alcohol Use No   Alcohol/week: 0.0 standard drinks     Family History  Problem Relation Age of Onset   Hypertension Father    Heart disease Father    Hyperlipidemia Father    Stroke Father    Heart attack Father    Diabetes Paternal Grandmother    COPD Mother    Cancer Maternal Grandmother    Liver disease Maternal Grandfather        alcoholic     Current Outpatient Medications on File Prior to Visit  Medication Sig Dispense Refill   cephALEXin (KEFLEX) 500 MG capsule Take 1 capsule (500 mg total) by mouth 3 (three) times daily. (Patient not taking: Reported on  01/02/2021) 21 capsule 0   ibuprofen (ADVIL) 800 MG tablet Take 1 tablet (800 mg total) by mouth every 8 (eight) hours as needed. (Patient not taking: Reported on 01/02/2021) 30 tablet 1   LORazepam (ATIVAN) 1 MG tablet Take 1/2 to 1 tablet every 8 hours prn anxiety and prior to procedures (Patient not taking: Reported on 03/31/2021) 2 tablet 0   metroNIDAZOLE (FLAGYL) 500 MG tablet Take 1 tablet (500 mg total) by mouth 2 (two) times daily. 14 tablet 0   norethindrone (MICRONOR) 0.35 MG tablet Take 1 tablet (0.35 mg total) by mouth daily. 28 tablet 12   phenazopyridine (PYRIDIUM) 100 MG tablet Take 1 tablet (100 mg total) by mouth in the morning and at bedtime. (Patient not taking: Reported on 01/02/2021) 14 tablet 0   No current facility-administered medications on file prior to visit.    Cardiovascular and other pertinent studies:  EKG 05/30/2021: Sinus tachycardia 102 bpm Occasional PAC    Recent labs: 04/11/2021: Glucose 88, BUN/Cr 13/0.74. EGFR 107. Na/K 139/4.3. Rest of the CMP normal Chol 215, TG 81, HDL 43, LDL 154 TSH NA   Review of Systems  Cardiovascular:  Positive for palpitations. Negative for chest pain, dyspnea on exertion, leg swelling and syncope.  Vitals:   05/30/21 1341 05/30/21 1346  BP: (!) 140/96 (!) 126/92  Pulse: (!) 105 (!) 108  Resp: 16   SpO2: 97% 98%     Body mass index is 32.34 kg/m. Filed Weights   05/30/21 1341  Weight: 176 lb 12.8 oz (80.2 kg)     Objective:   Physical Exam Vitals and nursing note reviewed.  Constitutional:      General: She is not in acute distress. Neck:     Vascular: No JVD.  Cardiovascular:     Rate and Rhythm: Regular rhythm. Tachycardia present.     Heart sounds: Normal heart sounds. No murmur heard. Pulmonary:     Effort: Pulmonary effort is normal.     Breath sounds: Normal breath sounds. No wheezing or rales.  Musculoskeletal:     Right lower leg: No edema.     Left lower leg: No edema.        Assessment & Recommendations:    36 y.o. Caucasian female with mixed hyperlipidemia, current tobacco abuse, family history early CAD, referred for palpitations  Palpitations: Likely due to deconditioning. Recommend cardiac telemetry.  Mixed hyperlipidemia, family h/o early CAD: Recommend CT cardiac scoring, after pregnancy test. Also recommend lipoprotein (a). Discussed heart healthy diet and exercise.   Thank you for referring the patient to [place]. Please feel free to contact with any questions.   Nigel Mormon, MD Pager: [phone] Office: [phone]

## 2021-05-30 ENCOUNTER — Encounter: Payer: Self-pay | Admitting: Cardiology

## 2021-05-30 ENCOUNTER — Ambulatory Visit: Payer: Medicaid Other | Admitting: Cardiology

## 2021-05-30 ENCOUNTER — Other Ambulatory Visit: Payer: Self-pay

## 2021-05-30 ENCOUNTER — Inpatient Hospital Stay: Payer: Medicaid Other

## 2021-05-30 VITALS — BP 126/92 | HR 108 | Resp 16 | Ht 62.0 in | Wt 176.8 lb

## 2021-05-30 DIAGNOSIS — R002 Palpitations: Secondary | ICD-10-CM

## 2021-05-30 DIAGNOSIS — E782 Mixed hyperlipidemia: Secondary | ICD-10-CM

## 2021-05-30 DIAGNOSIS — Z8249 Family history of ischemic heart disease and other diseases of the circulatory system: Secondary | ICD-10-CM | POA: Insufficient documentation

## 2021-05-30 HISTORY — DX: Mixed hyperlipidemia: E78.2

## 2021-05-30 HISTORY — DX: Family history of ischemic heart disease and other diseases of the circulatory system: Z82.49

## 2021-06-04 ENCOUNTER — Telehealth: Payer: Self-pay

## 2021-06-04 NOTE — Telephone Encounter (Signed)
Pt coming in tomorrow

## 2021-06-04 NOTE — Telephone Encounter (Signed)
Patients monitor has been approved . I tried calling twice

## 2021-06-05 ENCOUNTER — Other Ambulatory Visit: Payer: Medicaid Other

## 2021-06-05 ENCOUNTER — Other Ambulatory Visit: Payer: Self-pay

## 2021-06-11 ENCOUNTER — Other Ambulatory Visit: Payer: Medicaid Other

## 2021-06-17 NOTE — Telephone Encounter (Signed)
Completed.

## 2021-06-19 ENCOUNTER — Other Ambulatory Visit: Payer: Medicaid Other

## 2021-06-26 ENCOUNTER — Ambulatory Visit
Admission: RE | Admit: 2021-06-26 | Discharge: 2021-06-26 | Disposition: A | Discharge status: Home / Self Care | Payer: Medicaid Other | Source: Ambulatory Visit | Attending: Family Medicine | Admitting: Family Medicine

## 2021-06-26 ENCOUNTER — Other Ambulatory Visit: Payer: Self-pay

## 2021-06-26 ENCOUNTER — Other Ambulatory Visit: Payer: Self-pay | Admitting: Family Medicine

## 2021-06-26 DIAGNOSIS — N63 Unspecified lump in unspecified breast: Secondary | ICD-10-CM

## 2021-06-26 DIAGNOSIS — N6310 Unspecified lump in the right breast, unspecified quadrant: Secondary | ICD-10-CM

## 2021-06-26 DIAGNOSIS — N6320 Unspecified lump in the left breast, unspecified quadrant: Secondary | ICD-10-CM

## 2021-06-27 ENCOUNTER — Ambulatory Visit: Payer: Medicaid Other | Admitting: Cardiology

## 2021-06-30 ENCOUNTER — Other Ambulatory Visit: Payer: Self-pay

## 2021-06-30 ENCOUNTER — Ambulatory Visit (INDEPENDENT_AMBULATORY_CARE_PROVIDER_SITE_OTHER): Payer: Medicaid Other

## 2021-06-30 ENCOUNTER — Other Ambulatory Visit (HOSPITAL_COMMUNITY)
Admission: RE | Admit: 2021-06-30 | Discharge: 2021-06-30 | Disposition: A | Discharge status: Home / Self Care | Payer: Medicaid Other | Source: Ambulatory Visit | Attending: Family Medicine | Admitting: Family Medicine

## 2021-06-30 VITALS — BP 121/75 | HR 75 | Wt 168.8 lb

## 2021-06-30 DIAGNOSIS — G43019 Migraine without aura, intractable, without status migrainosus: Secondary | ICD-10-CM | POA: Insufficient documentation

## 2021-06-30 DIAGNOSIS — R3915 Urgency of urination: Secondary | ICD-10-CM

## 2021-06-30 DIAGNOSIS — R87610 Atypical squamous cells of undetermined significance on cytologic smear of cervix (ASC-US): Secondary | ICD-10-CM | POA: Insufficient documentation

## 2021-06-30 DIAGNOSIS — N898 Other specified noninflammatory disorders of vagina: Secondary | ICD-10-CM | POA: Diagnosis not present

## 2021-06-30 DIAGNOSIS — R87619 Unspecified abnormal cytological findings in specimens from cervix uteri: Secondary | ICD-10-CM | POA: Insufficient documentation

## 2021-06-30 HISTORY — DX: Atypical squamous cells of undetermined significance on cytologic smear of cervix (ASC-US): R87.610

## 2021-06-30 HISTORY — DX: Migraine without aura, intractable, without status migrainosus: G43.019

## 2021-06-30 LAB — POCT URINALYSIS DIP (DEVICE)
Bilirubin Urine: NEGATIVE
Glucose, UA: NEGATIVE mg/dL
Hgb urine dipstick: NEGATIVE
Ketones, ur: NEGATIVE mg/dL
Leukocytes,Ua: NEGATIVE
Nitrite: NEGATIVE
Protein, ur: NEGATIVE mg/dL
Specific Gravity, Urine: 1.02 (ref 1.005–1.030)
Urobilinogen, UA: 0.2 mg/dL (ref 0.0–1.0)
pH: 7 (ref 5.0–8.0)

## 2021-06-30 NOTE — Progress Notes (Signed)
Here today for vaginal self swab. Patient reports vaginal irritation and odor, smells like urine but patient is sure this is not urine. Reports urinary urgency. Also reports waking up 2-3 times in the night to urinate. UA is normal. Pt has urologist, encouraged pt follow up with their office. Self swab instructions given and specimen obtained, explained our office will be in contact with any abnormal results.   GAD-7 is positive. Patient states she is in a better place mentally and feels ready to see a psychiatrist. Has seen [facility] prior. Recommended pt schedule appt with [name] for follow up and referral to psychiatry if desired. Pt agreeable.   Fleet Contras RN 06/30/21

## 2021-07-01 ENCOUNTER — Other Ambulatory Visit: Payer: Self-pay | Admitting: Obstetrics and Gynecology

## 2021-07-01 DIAGNOSIS — N898 Other specified noninflammatory disorders of vagina: Secondary | ICD-10-CM

## 2021-07-01 LAB — CERVICOVAGINAL ANCILLARY ONLY
Bacterial Vaginitis (gardnerella): NEGATIVE
Candida Glabrata: NEGATIVE
Candida Vaginitis: POSITIVE — AB
Chlamydia: NEGATIVE
Comment: NEGATIVE
Comment: NEGATIVE
Comment: NEGATIVE
Comment: NEGATIVE
Comment: NEGATIVE
Comment: NORMAL
Neisseria Gonorrhea: NEGATIVE
Trichomonas: NEGATIVE

## 2021-07-01 MED ORDER — FLUCONAZOLE 150 MG PO TABS: 150.0000 mg | ORAL_TABLET | ORAL | 0 refills | Status: DC

## 2021-07-02 NOTE — BH Specialist Note (Signed)
Pt did not arrive to video visit and did not answer the phone; Left HIPPA-compliant message to call back Averey Trompeter from [facility] at  [phone] (Westen Dinino's office).  ?; left MyChart message for patient.  ? ?

## 2021-07-07 ENCOUNTER — Ambulatory Visit: Payer: Medicaid Other | Admitting: Clinical

## 2021-07-07 DIAGNOSIS — Z91199 Patient's noncompliance with other medical treatment and regimen due to unspecified reason: Secondary | ICD-10-CM

## 2021-07-11 ENCOUNTER — Other Ambulatory Visit: Payer: Medicaid Other

## 2021-07-23 ENCOUNTER — Inpatient Hospital Stay: Admission: RE | Admit: 2021-07-23 | Payer: Medicaid Other | Source: Ambulatory Visit

## 2021-07-23 ENCOUNTER — Ambulatory Visit: Payer: Medicaid Other | Admitting: Cardiology

## 2021-07-23 NOTE — Progress Notes (Deleted)
? ? ?  Patient referred by Trey Sailors, PA for palpitations ? ?Subjective:  ? ?Jill Burke, female    DOB: [birth year], 36 y.o.   MRN: [id number] ? ? ?No chief complaint on file. ? ? ? ?HPI ? ?36 y.o. Caucasian female with mixed hyperlipidemia, current tobacco abuse, family history early CAD, referred for palpitations ? ?*** ? 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 She has been a pack a day smoker, now down to 3 cigarettes/day and has set a quit date for next week. She has experienced palpitations and tachycardia at rest, worse with activity. She admits to not being physically active since she started her own business in 2020. She denies chest pain, shortness of breath, leg edema, orthopnea, PND, TIA/syncope. ? ?She has made changes to her diet, after recent lipid panel. She has been prescribed a statin by her PCP, but has not started it yet.  ? ? ?Current Outpatient Medications:  ?  fluconazole (DIFLUCAN) 150 MG tablet, Take 1 tablet (150 mg total) by mouth every 3 (three) days. For three doses, Disp: 3 tablet, Rfl: 0 ?  gabapentin (NEURONTIN) 100 MG capsule, Take 100 mg by mouth 2 (two) times daily., Disp: , Rfl:  ?  propranolol (INDERAL) 10 MG tablet, Take 10 mg by mouth 2 (two) times daily., Disp: , Rfl:  ?  rosuvastatin (CRESTOR) 20 MG tablet, Take 20 mg by mouth daily., Disp: , Rfl:  ?  Vitamin D, Ergocalciferol, (DRISDOL) 1.25 MG (50000 UNIT) CAPS capsule, Take 50,000 Units by mouth every 7 (seven) days., Disp: , Rfl:  ? ? ? ?Cardiovascular and other pertinent studies: ? ?EKG 05/30/2021: ?Sinus tachycardia 102 bpm ?Occasional PAC ? ? ? ?Recent labs: ?04/11/2021: ?Glucose 88, BUN/Cr 13/0.74. EGFR 107. Na/K 139/4.3. Rest of the CMP normal ?Chol 215, TG 81, HDL 43, LDL 154 ?TSH NA ? ? ?Review of Systems  ?Cardiovascular:  Positive for palpitations. Negative for chest pain, dyspnea on exertion, leg swelling and syncope.  ? ?   ? ? ?There were no vitals filed  for this visit. ? ? ? ?There is no height or weight on file to calculate BMI. ?There were no vitals filed for this visit. ? ? ? ?Objective:  ? Physical Exam ?Vitals and nursing note reviewed.  ?Constitutional:   ?   General: She is not in acute distress. ?Neck:  ?   Vascular: No JVD.  ?Cardiovascular:  ?   Rate and Rhythm: Regular rhythm. Tachycardia present.  ?   Heart sounds: Normal heart sounds. No murmur heard. ?Pulmonary:  ?   Effort: Pulmonary effort is normal.  ?   Breath sounds: Normal breath sounds. No wheezing or rales.  ?Musculoskeletal:  ?   Right lower leg: No edema.  ?   Left lower leg: No edema.  ? ?   ? ?Assessment & Recommendations:  ? ? ?36 y.o. Caucasian female with mixed hyperlipidemia, current tobacco abuse, family history early CAD, referred for palpitations ? ?Palpitations: ?Likely due to deconditioning. Recommend cardiac telemetry. ? ?Mixed hyperlipidemia, family h/o early CAD: ?Recommend CT cardiac scoring, after pregnancy test. ?Also recommend lipoprotein (a). ?Discussed heart healthy diet and exercise.  ? ?Thank you for referring the patient to [place]. Please feel free to contact with any questions. ? ? ?Nigel Mormon, MD ?Pager: [phone] ?Office: [phone]  ?

## 2021-10-08 ENCOUNTER — Telehealth: Payer: Self-pay | Admitting: Family Medicine

## 2021-10-08 NOTE — Telephone Encounter (Signed)
Patient called in stating she has been on a round of antibiotics and was prescribed a pill to help with a yeast infection because she gets one every time she is on antibiotics but she thinks she took the pill too soon so want to know if she can be prescribed another one or if she has to come into the office to be tested.

## 2021-10-08 NOTE — Telephone Encounter (Signed)
Returned patients call. Patient reports she is in [place] and was placed on ATB for sinus infections and she took one pill of Diflucan prior to finishing ATB on day 4-5 day of ATB.   She reports she is on her period and she notices some discomfort and is not seeing a thick white clumpy discharge. She thinks the discharge may be a clear discharge. Reviewed it does not meet the typical symptoms of yeast and would not usually give Diflucan without testing. She reports she has a PCP appointment on Monday and will discuss with them. Patient voiced understanding.

## 2021-11-02 IMAGING — CT CT CERVICAL SPINE W/O CM
3 of 4 series · 13 of 33 positions shown, 16 images · non-contrast
Comparison: None.

CLINICAL DATA: 34-year-old female with a history neck pain and back
pain after a fall

EXAM:
CT CERVICAL SPINE WITHOUT CONTRAST
TECHNIQUE: Multidetector CT imaging of the cervical spine was performed without
intravenous contrast. Multiplanar CT image reconstructions were also
generated.

[Series 5: orthogonal axials · axial · 0.31mm/px · z∈[-288,-173]mm · 5 of 96 slices shown, 7 images]
[im 16/96  soft-tissue]
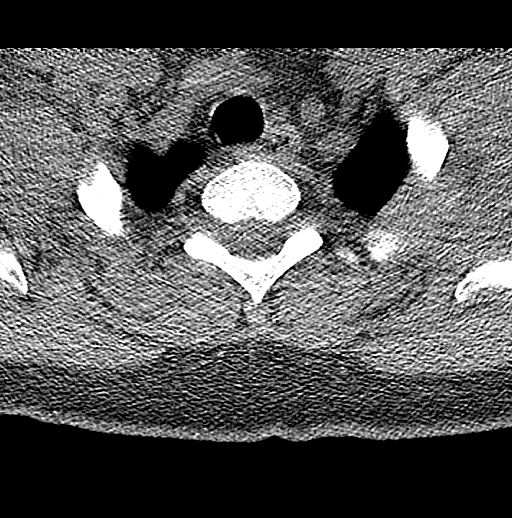
[im 16/96  bone]
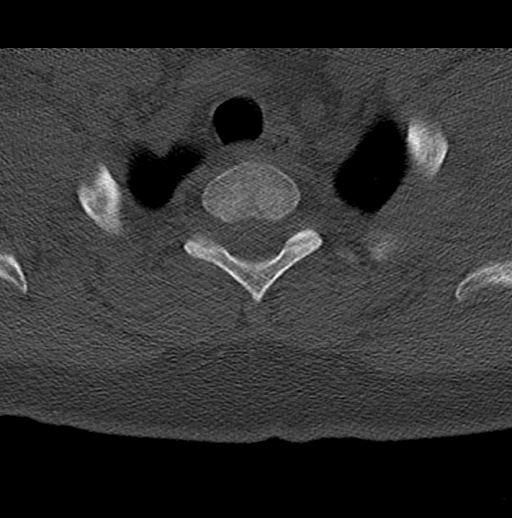
[im 32/96  bone]
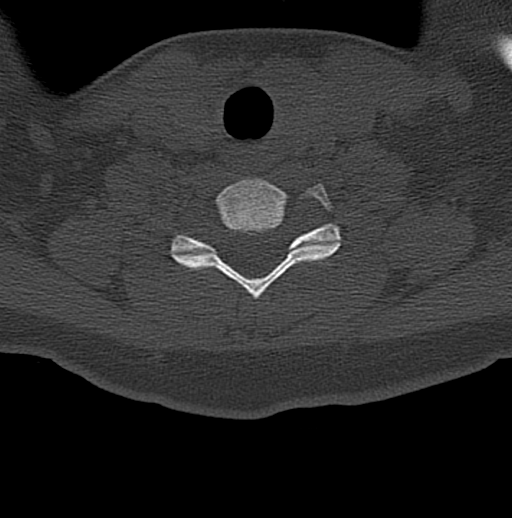
[im 48/96  bone]
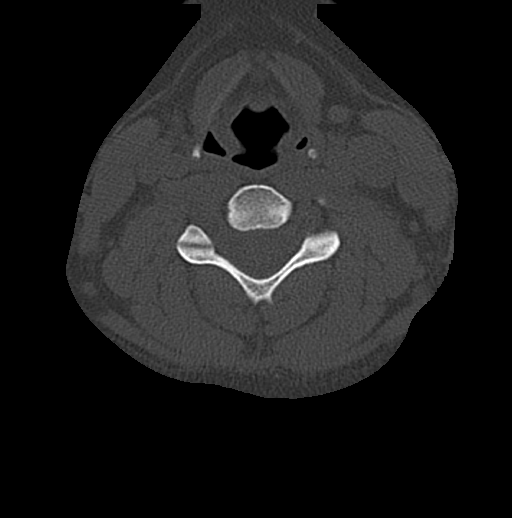
[im 64/96  bone]
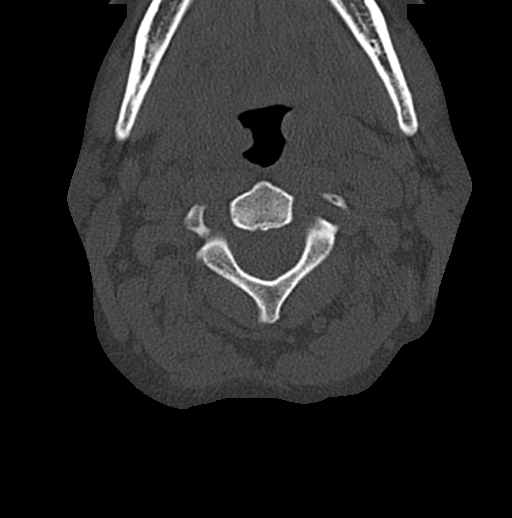
[im 80/96  soft-tissue]
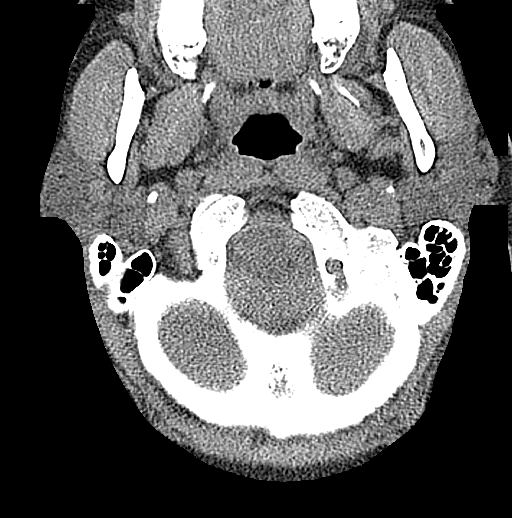
[im 80/96  bone]
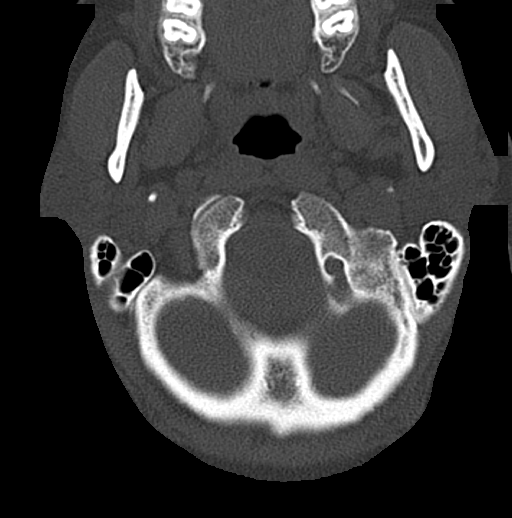

[Series 6: coronal bone · coronal · 0.26mm/px · 3 of 49 slices shown]
[im 10/49  bone]
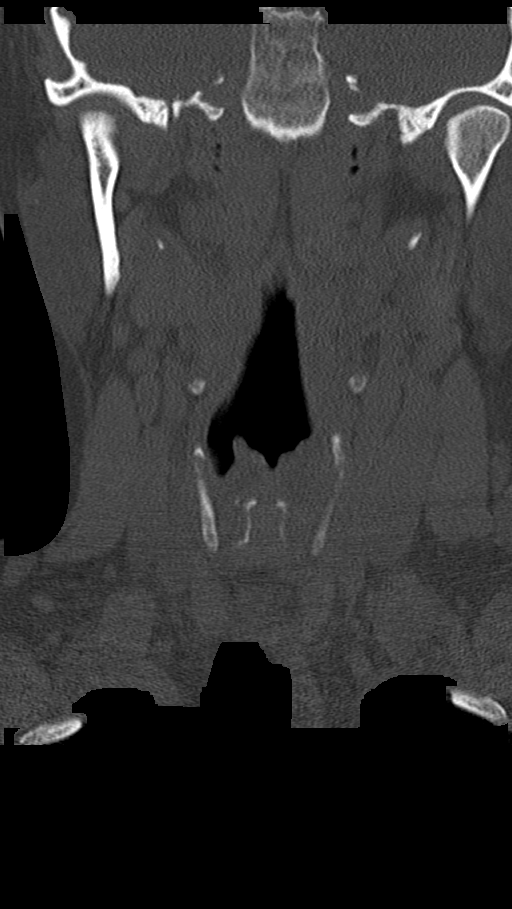
[im 20/49  bone]
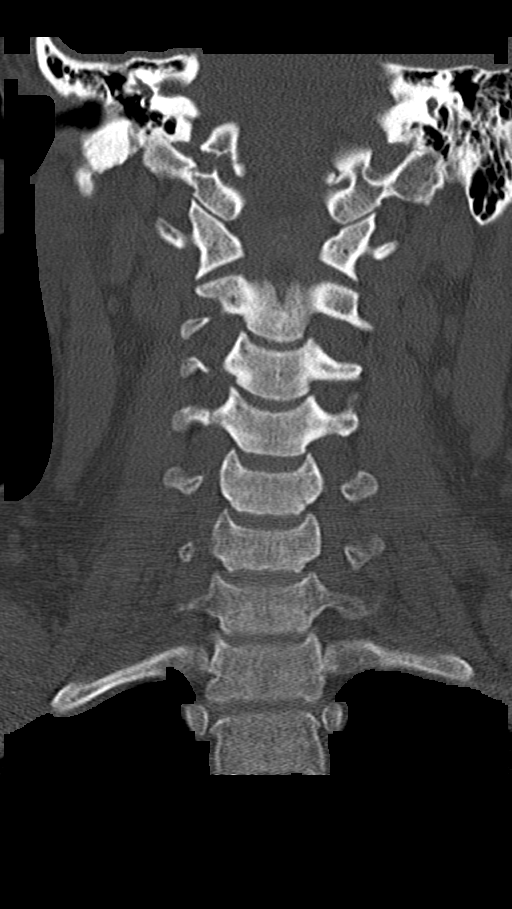
[im 29/49  bone]
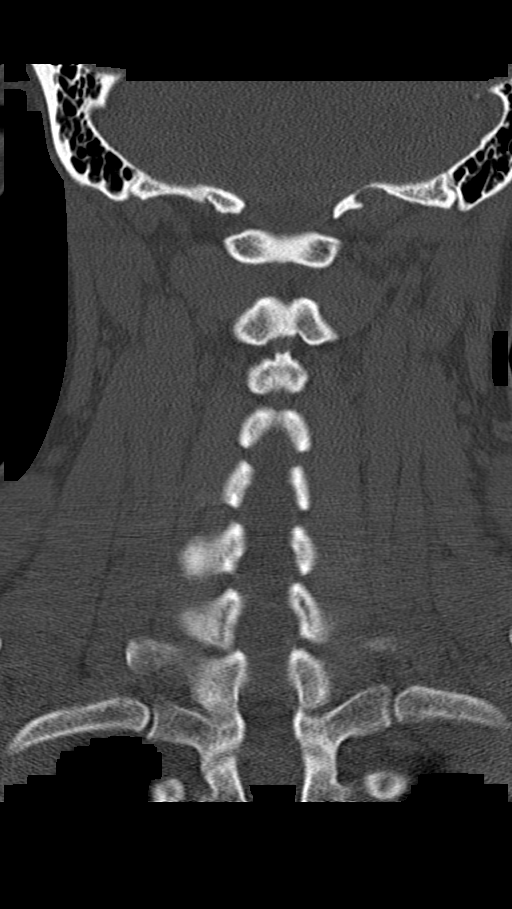

[Series 7: sagittal bone · sagittal · 0.41mm/px · 5 of 48 slices shown, 6 images]
[im 16/48  bone]
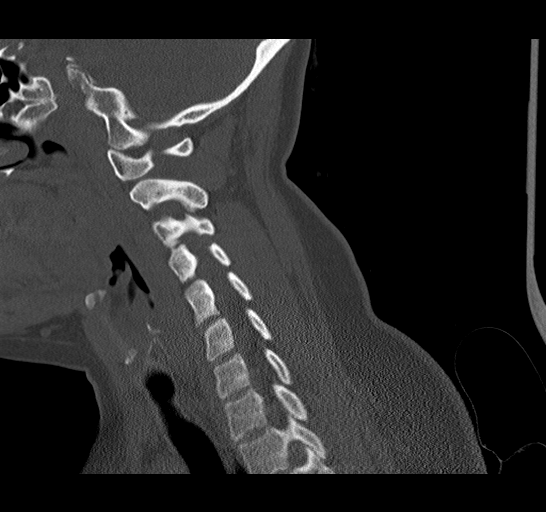
[im 20/48  bone]
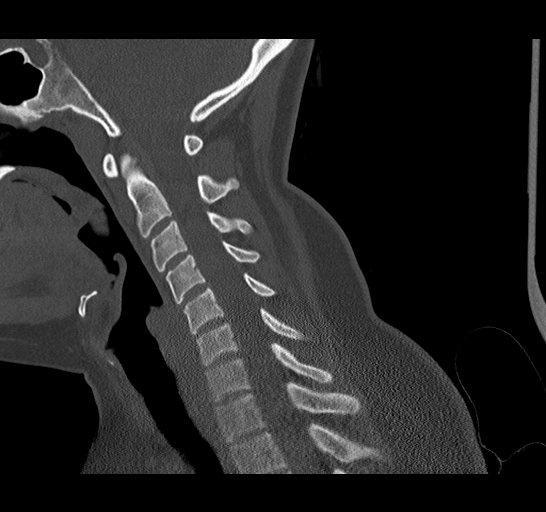
[im 24/48  soft-tissue]
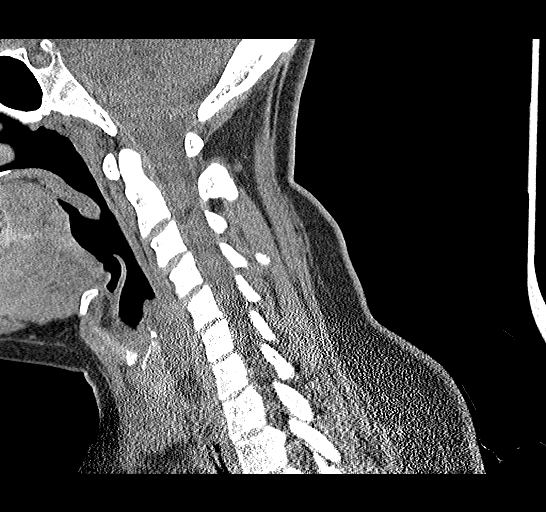
[im 24/48  bone]
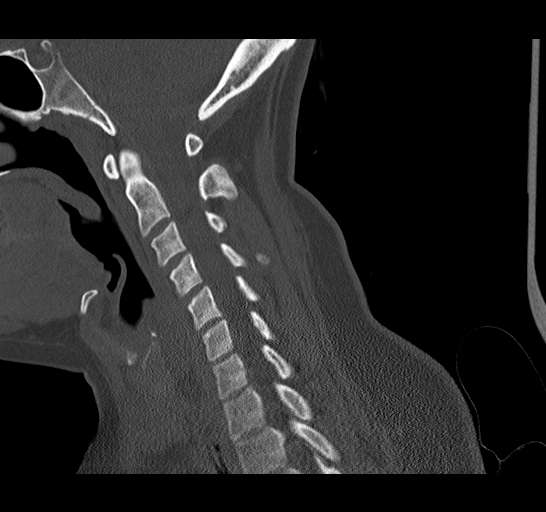
[im 28/48  bone]
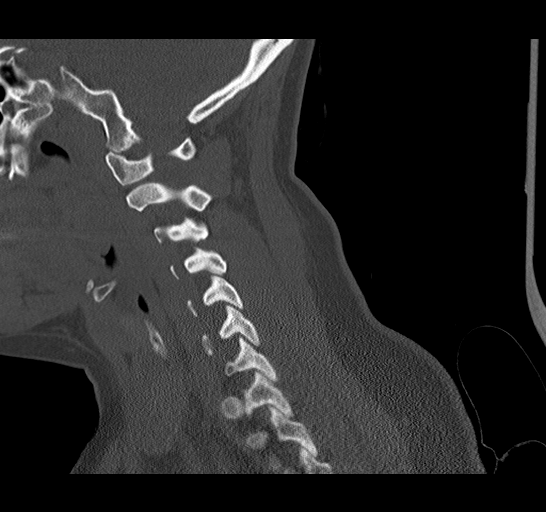
[im 32/48  bone]
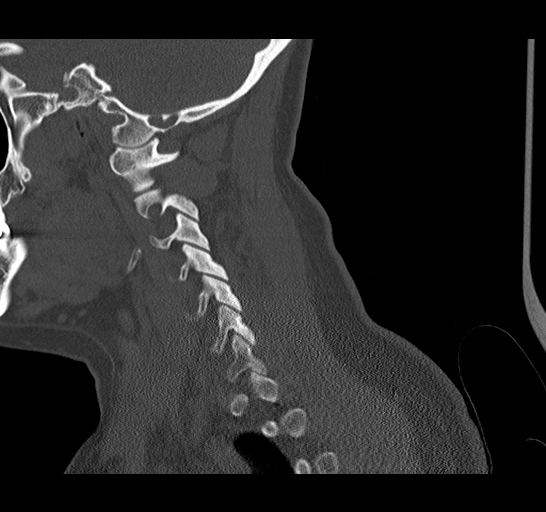

[13 of 33 positions shown; findings below may reference images not displayed]

FINDINGS: Alignment: Craniocervical junction aligned. Anatomic alignment of
the cervical elements. No subluxation.

Skull base and vertebrae: No acute fracture at the skullbase.
Vertebral body heights relatively maintained. No acute fracture
identified.

Soft tissues and spinal canal: Unremarkable cervical soft tissues.
Lymph nodes are present, though not enlarged.

Disc levels: Unremarkable appearance of disc space, which are
maintained.

Upper chest: Unremarkable appearance of the lung apices.

Other: No bony canal narrowing.
IMPRESSION: Negative for acute fracture or malalignment of the cervical spine

## 2021-12-26 ENCOUNTER — Other Ambulatory Visit: Payer: Medicaid Other

## 2022-07-09 ENCOUNTER — Other Ambulatory Visit (HOSPITAL_COMMUNITY)
Admission: RE | Admit: 2022-07-09 | Discharge: 2022-07-09 | Disposition: A | Discharge status: Home / Self Care | Payer: Medicaid Other | Source: Ambulatory Visit | Attending: Obstetrics and Gynecology | Admitting: Obstetrics and Gynecology

## 2022-07-09 ENCOUNTER — Encounter: Payer: Self-pay | Admitting: Obstetrics and Gynecology

## 2022-07-09 ENCOUNTER — Other Ambulatory Visit: Payer: Self-pay

## 2022-07-09 ENCOUNTER — Ambulatory Visit (INDEPENDENT_AMBULATORY_CARE_PROVIDER_SITE_OTHER): Payer: Medicaid Other | Admitting: Obstetrics and Gynecology

## 2022-07-09 VITALS — BP 115/81 | HR 94 | Ht 62.0 in | Wt 159.0 lb

## 2022-07-09 DIAGNOSIS — R1030 Lower abdominal pain, unspecified: Secondary | ICD-10-CM

## 2022-07-09 LAB — POCT URINALYSIS DIP (DEVICE)
Bilirubin Urine: NEGATIVE
Glucose, UA: NEGATIVE mg/dL
Hgb urine dipstick: NEGATIVE
Ketones, ur: NEGATIVE mg/dL
Leukocytes,Ua: NEGATIVE
Nitrite: NEGATIVE
Protein, ur: NEGATIVE mg/dL
Specific Gravity, Urine: 1.015 (ref 1.005–1.030)
Urobilinogen, UA: 0.2 mg/dL (ref 0.0–1.0)
pH: 5.5 (ref 5.0–8.0)

## 2022-07-09 MED ORDER — DOXYCYCLINE HYCLATE 100 MG PO CAPS: 100.0000 mg | ORAL_CAPSULE | Freq: Two times a day (BID) | ORAL | 0 refills | Status: AC

## 2022-07-09 MED ORDER — CYCLOBENZAPRINE HCL 10 MG PO TABS: 5.0000 mg | ORAL_TABLET | Freq: Two times a day (BID) | ORAL | 0 refills | Status: DC | PRN
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
## 2022-07-09 NOTE — Patient Instructions (Signed)
I have ordered an ultrasound of the pelvis. We are re-checking for a vaginal infection. Your uterus was little tender on exam, so I have sent in an antibiotic called doxycyline. I've also sent in a few pills of a muscle relaxer to take for the pain. Be mindful this medication can make you drowsy. If the vaginal swabs come back for anything, we will contact you with the results.

## 2022-07-10 LAB — CERVICOVAGINAL ANCILLARY ONLY
Bacterial Vaginitis (gardnerella): NEGATIVE
Candida Glabrata: NEGATIVE
Candida Vaginitis: NEGATIVE
Comment: NEGATIVE
Comment: NEGATIVE
Comment: NEGATIVE

## 2022-07-10 LAB — POCT PREGNANCY, URINE: Preg Test, Ur: NEGATIVE

## 2022-07-15 ENCOUNTER — Other Ambulatory Visit: Payer: Medicaid Other

## 2022-07-16 ENCOUNTER — Other Ambulatory Visit: Payer: Medicaid Other

## 2022-07-21 ENCOUNTER — Ambulatory Visit: Admission: RE | Admit: 2022-07-21 | Payer: Medicaid Other | Source: Ambulatory Visit

## 2022-07-23 ENCOUNTER — Ambulatory Visit
Admission: RE | Admit: 2022-07-23 | Discharge: 2022-07-23 | Disposition: A | Discharge status: Home / Self Care | Payer: Medicaid Other | Source: Ambulatory Visit | Attending: Obstetrics and Gynecology | Admitting: Obstetrics and Gynecology

## 2022-07-23 DIAGNOSIS — R1030 Lower abdominal pain, unspecified: Secondary | ICD-10-CM | POA: Diagnosis present

## 2022-07-27 ENCOUNTER — Encounter: Payer: Self-pay | Admitting: Obstetrics and Gynecology

## 2022-08-24 ENCOUNTER — Telehealth: Payer: Self-pay | Admitting: Family Medicine

## 2022-08-24 NOTE — Telephone Encounter (Signed)
Patient is needing more pain medication due to her Fibroids, she did her Ultra Sound but still having pain

## 2022-08-25 ENCOUNTER — Other Ambulatory Visit: Payer: Self-pay | Admitting: Physician Assistant

## 2022-08-25 DIAGNOSIS — N6320 Unspecified lump in the left breast, unspecified quadrant: Secondary | ICD-10-CM

## 2022-08-25 DIAGNOSIS — N6310 Unspecified lump in the right breast, unspecified quadrant: Secondary | ICD-10-CM

## 2022-08-25 NOTE — Telephone Encounter (Signed)
Called pt and pt informed me that she is out of medication doxycycline that was used for her abdominal pain and back pain.  Pt reports that she is continuing to have the same pain and would like more medication.  Pt reports that she did have a positive pregnancy test at home.  Pt reports LMP 06/23/22.  Pt advised that she will need to do a walk in pregnancy test to get established care.  I advised pt that the discomfort she is feeling may not be related to the fibroid but due to pregnancy.  Pt advised that I would send her information about fibroids and I also informed pt that patients have healthy pregnancies with having fibroids.  Pt verbalized understanding.   Leonette Nutting  08/25/22

## 2022-08-26 ENCOUNTER — Ambulatory Visit (INDEPENDENT_AMBULATORY_CARE_PROVIDER_SITE_OTHER): Payer: Medicaid Other | Admitting: *Deleted

## 2022-08-26 DIAGNOSIS — Z3202 Encounter for pregnancy test, result negative: Secondary | ICD-10-CM | POA: Diagnosis not present

## 2022-08-26 DIAGNOSIS — Z3200 Encounter for pregnancy test, result unknown: Secondary | ICD-10-CM

## 2022-08-26 NOTE — Progress Notes (Signed)
Patient dropped off urine for pregnancy test which was negative. I called her and informed of negative pregnancy test. She states she is late for her period and had a home pregnancy test that was positive. I explained I would recommend she do another pregnancy test visit in one week if she has not gotten her period. I scheduled her for another pregnancy test next week. I explained she should stay and talk with  nurse. If still negative, we can do blood pregnancy test. She voices understanding. Nancy Fetter

## 2022-08-27 ENCOUNTER — Telehealth: Payer: Self-pay | Admitting: *Deleted

## 2022-08-27 LAB — POCT PREGNANCY, URINE: Preg Test, Ur: NEGATIVE

## 2022-08-27 NOTE — Telephone Encounter (Signed)
Pt left voicemail message stating that she has some questions about her fibroid seen on ultrasound. Also, she has had visit with her PCP today and the PCP is not able to see the images from the ultrasound. Her PCP is requesting the images to be sent to her or uploaded so that she can see them. PCP is: Norva Riffle @ [facility] on [address] [facility].

## 2022-09-02 NOTE — Telephone Encounter (Signed)
Attempted to call pt unable to leave message due to VM box full.   Addison Naegeli, RN

## 2022-09-03 ENCOUNTER — Ambulatory Visit: Payer: Medicaid Other

## 2022-09-08 ENCOUNTER — Ambulatory Visit
Admission: RE | Admit: 2022-09-08 | Discharge: 2022-09-08 | Disposition: A | Discharge status: Home / Self Care | Payer: Medicaid Other | Source: Ambulatory Visit | Attending: Physician Assistant | Admitting: Physician Assistant

## 2022-09-08 DIAGNOSIS — N6320 Unspecified lump in the left breast, unspecified quadrant: Secondary | ICD-10-CM

## 2022-09-08 DIAGNOSIS — N6310 Unspecified lump in the right breast, unspecified quadrant: Secondary | ICD-10-CM

## 2022-09-09 ENCOUNTER — Ambulatory Visit: Payer: Medicaid Other | Admitting: Family Medicine

## 2022-09-29 ENCOUNTER — Ambulatory Visit: Payer: Medicaid Other | Admitting: Obstetrics and Gynecology

## 2022-10-12 ENCOUNTER — Ambulatory Visit: Payer: Medicaid Other | Admitting: Obstetrics and Gynecology

## 2022-11-11 ENCOUNTER — Inpatient Hospital Stay (HOSPITAL_COMMUNITY): Payer: Medicaid Other

## 2022-11-11 ENCOUNTER — Inpatient Hospital Stay (HOSPITAL_COMMUNITY)
Admission: AD | Admit: 2022-11-11 | Discharge: 2022-11-11 | Disposition: A | Discharge status: Home / Self Care | Payer: Medicaid Other | Attending: Obstetrics and Gynecology | Admitting: Obstetrics and Gynecology

## 2022-11-11 ENCOUNTER — Other Ambulatory Visit: Payer: Self-pay

## 2022-11-11 ENCOUNTER — Encounter (HOSPITAL_COMMUNITY): Payer: Self-pay | Admitting: Obstetrics and Gynecology

## 2022-11-11 DIAGNOSIS — Z8619 Personal history of other infectious and parasitic diseases: Secondary | ICD-10-CM | POA: Insufficient documentation

## 2022-11-11 DIAGNOSIS — Z3A01 Less than 8 weeks gestation of pregnancy: Secondary | ICD-10-CM | POA: Diagnosis not present

## 2022-11-11 DIAGNOSIS — F17210 Nicotine dependence, cigarettes, uncomplicated: Secondary | ICD-10-CM | POA: Diagnosis not present

## 2022-11-11 DIAGNOSIS — R102 Pelvic and perineal pain: Secondary | ICD-10-CM | POA: Diagnosis not present

## 2022-11-11 DIAGNOSIS — D259 Leiomyoma of uterus, unspecified: Secondary | ICD-10-CM | POA: Diagnosis not present

## 2022-11-11 DIAGNOSIS — O26891 Other specified pregnancy related conditions, first trimester: Secondary | ICD-10-CM | POA: Diagnosis present

## 2022-11-11 DIAGNOSIS — Z8759 Personal history of other complications of pregnancy, childbirth and the puerperium: Secondary | ICD-10-CM

## 2022-11-11 DIAGNOSIS — O3411 Maternal care for benign tumor of corpus uteri, first trimester: Secondary | ICD-10-CM | POA: Insufficient documentation

## 2022-11-11 DIAGNOSIS — O99331 Smoking (tobacco) complicating pregnancy, first trimester: Secondary | ICD-10-CM | POA: Diagnosis not present

## 2022-11-11 HISTORY — DX: Personal history of other complications of pregnancy, childbirth and the puerperium: Z87.59

## 2022-11-11 LAB — HCG, QUANTITATIVE, PREGNANCY: hCG, Beta Chain, Quant, S: 99508 m[IU]/mL — ABNORMAL HIGH (ref <5)

## 2022-11-11 LAB — URINALYSIS, ROUTINE W REFLEX MICROSCOPIC
Bilirubin Urine: NEGATIVE
Glucose, UA: 50 mg/dL — AB
Hgb urine dipstick: NEGATIVE
Ketones, ur: NEGATIVE mg/dL
Leukocytes,Ua: NEGATIVE
Nitrite: NEGATIVE
Protein, ur: NEGATIVE mg/dL
Specific Gravity, Urine: 1.017 (ref 1.005–1.030)
pH: 6 (ref 5.0–8.0)

## 2022-11-11 LAB — POCT PREGNANCY, URINE: Preg Test, Ur: POSITIVE — AB

## 2022-11-11 MED ORDER — ONDANSETRON 4 MG PO TBDP: 4.0000 mg | ORAL_TABLET | Freq: Four times a day (QID) | ORAL | 0 refills | Status: DC | PRN

## 2022-11-11 MED ORDER — POLYETHYLENE GLYCOL 3350 17 GM/SCOOP PO POWD: 17.0000 g | Freq: Every day | ORAL | 1 refills | Status: DC | PRN

## 2022-11-11 MED ORDER — PROMETHAZINE HCL 25 MG PO TABS: 25.0000 mg | ORAL_TABLET | Freq: Four times a day (QID) | ORAL | 0 refills | Status: DC | PRN

## 2022-11-11 MED ORDER — ACETAMINOPHEN 325 MG PO TABS: 650.0000 mg | ORAL_TABLET | Freq: Four times a day (QID) | ORAL | 0 refills | Status: DC | PRN

## 2022-11-11 NOTE — Progress Notes (Signed)
MAU Provider Note  History  [id number]  Arrival date and time: 11/11/22 1529   No chief complaint on file.    HPI Jill Burke is a 37 y.o. [id number] at [redacted]w[redacted]d by LMP (May 12th) with PMHx notable for tubal pregnancy (2022) and miscarriage (Sept 2023), who presents for bilateral lower quadrant pain. She states that she's experienced a constant pressure in her lower abdomen since last Thursday, which prompted her to call the ambulance because she "felt like she was dying." She did not seek further medical care, but states that her pain has increasingly worsened to an intermittent stabbing pain that is currently a 8/10 in severity. This pain radiates to her right upper back, tailbone and mid-epigastric, and is worsened by sitting up. She has also experienced constipation and two weeks of nausea.   Of note, her LMP was May 12th. Her PMHx is significant for Gonorrhea, Chlamydia and HSV 1 and 2.   Vaginal bleeding: No LOF: No Fetal Movement: No Contractions: No     OB History  Gravida Para Term Preterm AB Living  7 3 3   3 3   SAB IAB Ectopic Multiple Live Births  2   1   3     # Outcome Date GA Lbr Len/2nd Weight Sex Delivery Anes PTL Lv  7 Current           6 Ectopic 09/25/20          5 SAB           4 SAB           3 Term      Vag-Spont   LIV  2 Term      Vag-Spont   LIV  1 Term      Vag-Spont   LIV     Past Medical History:  Diagnosis Date   Anxiety    Chlamydia 2007   treated   Lump, breast    Panic attack     Past Surgical History:  Procedure Laterality Date   ECTOPIC PREGNANCY SHOTS      Family History  Problem Relation Age of Onset   COPD Mother    Hypertension Father    Heart disease Father    Hyperlipidemia Father    Stroke Father    Heart attack Father    Cancer Maternal Grandmother    Liver disease Maternal Grandfather        alcoholic   Diabetes Paternal Grandmother     Social History   Socioeconomic History   Marital status: Single    Spouse  name: Not on file   Number of children: 3   Years of education: 12   Highest education level: Not on file  Occupational History   Occupation: Software engineer    Employer: [employer]  Tobacco Use   Smoking status: Some Days    Packs/day: .5    Types: Cigarettes   Smokeless tobacco: Never  Vaping Use   Vaping Use: Never used  Substance and Sexual Activity   Alcohol use: No    Alcohol/week: 0.0 standard drinks of alcohol   Drug use: Not Currently    Types: Marijuana   Sexual activity: Yes  Other Topics Concern   Not on file  Social History Narrative   Not on file   Social Determinants of Health   Financial Resource Strain: Not on file  Food Insecurity: No Food Insecurity (07/09/2022)   Hunger Vital Sign    Worried About Running Out  of Food in the Last Year: Never true    Ran Out of Food in the Last Year: Never true  Transportation Needs: No Transportation Needs (07/09/2022)   PRAPARE - Administrator, Civil Service (Medical): No    Lack of Transportation (Non-Medical): No  Physical Activity: Not on file  Stress: Not on file  Social Connections: Not on file  Intimate Partner Violence: Not on file    Allergies  Allergen Reactions   Latex Dermatitis   Soap Dermatitis    Tide Clothes Soap Specifically    No current facility-administered medications on file prior to encounter.   Current Outpatient Medications on File Prior to Encounter  Medication Sig Dispense Refill   docusate sodium (COLACE) 50 MG capsule Take 50 mg by mouth 2 (two) times daily.     cyclobenzaprine (FLEXERIL) 10 MG tablet Take 0.5 tablets (5 mg total) by mouth 2 (two) times daily as needed for muscle spasms. 20 tablet 0   gabapentin (NEURONTIN) 100 MG capsule Take 100 mg by mouth 2 (two) times daily.      ROS:  Pertinent positives: increased urinary frequency Physical Exam   BP 107/63 (BP Location: Right Arm)   Pulse 84   Temp 98.3 F (36.8 C) (Oral)   Resp 18   Ht 5' 2.5" (1.588 m)    Wt 73 kg   LMP 09/20/2022   SpO2 97%   BMI 28.96 kg/m   Patient Vitals for the past 24 hrs:  BP Temp Temp src Pulse Resp SpO2 Height Weight  11/11/22 1553 107/63 98.3 F (36.8 C) Oral 84 18 97 % -- --  11/11/22 1546 -- -- -- -- -- -- 5' 2.5" (1.588 m) 73 kg    Physical Exam Cardiovascular:     Rate and Rhythm: Normal rate and regular rhythm.     Pulses: Normal pulses.     Heart sounds: Normal heart sounds.  Pulmonary:     Effort: Pulmonary effort is normal.     Breath sounds: Normal breath sounds.  Abdominal:     General: Bowel sounds are normal.     Palpations: Abdomen is soft.     Tenderness: There is abdominal tenderness.     Comments: Tenderness to light palpitation in bilateral LQ  Neurological:     Mental Status: She is alert.  Psychiatric:        Mood and Affect: Mood normal.        Behavior: Behavior normal.     Physical Exam Cardiovascular:     Rate and Rhythm: Normal rate and regular rhythm.     Pulses: Normal pulses.     Heart sounds: Normal heart sounds.  Pulmonary:     Effort: Pulmonary effort is normal.     Breath sounds: Normal breath sounds.  Abdominal:     General: Bowel sounds are normal.     Palpations: Abdomen is soft.     Tenderness: There is abdominal tenderness.     Comments: Tenderness to light palpitation in bilateral LQ  Neurological:     Mental Status: She is alert.  Psychiatric:        Mood and Affect: Mood normal.        Behavior: Behavior normal.     Cervical Exam    Bedside Ultrasound Pt informed that the ultrasound is considered a limited OB ultrasound and is not intended to be a complete ultrasound exam.  Patient also informed that the ultrasound is not being completed with  the intent of assessing for fetal or placental anomalies or any pelvic abnormalities.  Explained that the purpose of today's ultrasound is to assess for  viability.  Patient acknowledges the purpose of the exam and the limitations of the study.    My  read: IUGS seen with likely fetal pole and possible cardiac activity   Labs Results for orders placed or performed during the hospital encounter of 11/11/22 (from the past 24 hour(s))  Pregnancy, urine POC     Status: Abnormal   Collection Time: 11/11/22  4:00 PM  Result Value Ref Range   Preg Test, Ur POSITIVE (A) NEGATIVE  Urinalysis, Routine w reflex microscopic -Urine, Clean Catch     Status: Abnormal   Collection Time: 11/11/22  4:09 PM  Result Value Ref Range   Color, Urine YELLOW YELLOW   APPearance CLEAR CLEAR   Specific Gravity, Urine 1.017 1.005 - 1.030   pH 6.0 5.0 - 8.0   Glucose, UA 50 (A) NEGATIVE mg/dL   Hgb urine dipstick NEGATIVE NEGATIVE   Bilirubin Urine NEGATIVE NEGATIVE   Ketones, ur NEGATIVE NEGATIVE mg/dL   Protein, ur NEGATIVE NEGATIVE mg/dL   Nitrite NEGATIVE NEGATIVE   Leukocytes,Ua NEGATIVE NEGATIVE    Imaging US OB LESS THAN 14 WEEKS WITH OB TRANSVAGINAL  Result Date: 11/11/2022 CLINICAL DATA:  Lower pelvic pain with positive pregnancy test, initial encounter EXAM: OBSTETRIC <14 WK [place] AND TRANSVAGINAL OB US TECHNIQUE: Both transabdominal and transvaginal ultrasound examinations were performed for complete evaluation of the gestation as well as the maternal uterus, adnexal regions, and pelvic cul-de-sac. Transvaginal technique was performed to assess early pregnancy. COMPARISON:  None Available. FINDINGS: Intrauterine gestational sac: Present Yolk sac:  Present Embryo:  Present Cardiac Activity: Present Heart Rate: 112 bpm CRL:  7.4 mm   6 w   4 d                  [place] EDC: 07/03/2023 Subchorionic hemorrhage:  None visualized. Maternal uterus/adnexae: Ovaries are within normal limits bilaterally. 2.5 cm uterine fibroid is noted on the right. IMPRESSION: Single live intrauterine gestation at 6 weeks 4 days. Small uterine fibroid on the right. No acute abnormality is noted. Electronically Signed   By: Alcide Clever M.D.   On: 11/11/2022 19:45    MAU Course    Co  morbidities that complicate the patient evaluation: N/A  Interpreter services used: not applicable  Lab Tests: Urine Culture  I ordered, and personally interpreted labs.  The pertinent results include:  positive urine pregnancy test  Imaging Studies ordered:  I ordered imaging studies including Transvaginal US, hcg  Critical Interventions: N/A  MAU Course:  Dispostion: admitted to the hospital   Assessment and Plan  1. Pelvic pain in pregnancy, antepartum, first trimester  2. [redacted] weeks gestation of pregnancy    Jill Burke is a 37 y/o [phone] with PMHx significant for tubal ligation and miscarriage, presenting with bilateral lower quadrant pain with positive pregnancy test and admitted for further evaluation.     The differential diagnosis for her presentation includes normal pregnancy, ectopic pregnancy, appendicitis, ovarian cyst rupture and ovarian torsion. At the top of the differential is normal pregnancy. Bedside ultrasound makes ectopic pregnancy less likely, given the presence of a sac in the uterus. Ectopic pregnancy is essential to rule out, given her previous tubal pregnancy as well as risk factors of previous STIs. Will order a transvaginal ultrasound for better visualization. Appendicitis is less likely due to afebrile status.  Ovarian torsion and ovarian cyst rupture are acute emergencies but also less likely due to the subacute nature of her abdominal pain. - transvaginal Ultrasound - f/u Beta-hCG  - NSAIDs for pain control (would order IV route given patient's nausea may complicate PO intake)   Allergies as of 11/11/2022       Reactions   Latex Dermatitis   Soap Dermatitis   Tide Clothes Soap Specifically        Medication List     TAKE these medications    acetaminophen 325 MG tablet Commonly known as: Tylenol Take 2 tablets (650 mg total) by mouth every 6 (six) hours as needed.   cyclobenzaprine 10 MG tablet Commonly known as: FLEXERIL Take 0.5  tablets (5 mg total) by mouth 2 (two) times daily as needed for muscle spasms.   docusate sodium 50 MG capsule Commonly known as: COLACE Take 50 mg by mouth 2 (two) times daily.   gabapentin 100 MG capsule Commonly known as: NEURONTIN Take 100 mg by mouth 2 (two) times daily.   ondansetron 4 MG disintegrating tablet Commonly known as: ZOFRAN-ODT Take 1 tablet (4 mg total) by mouth every 6 (six) hours as needed for nausea.   polyethylene glycol powder 17 GM/SCOOP powder Commonly known as: GLYCOLAX/MIRALAX Take 17 g by mouth daily as needed.   promethazine 25 MG tablet Commonly known as: PHENERGAN Take 1 tablet (25 mg total) by mouth every 6 (six) hours as needed for nausea or vomiting.        Venora Maples, MD [facility], Center for [facility] 11/11/22  8:14 PM    ATTENDING ATTESTATION  I have seen and examined this patient and agree with the above documentation in the medical student's note except as below.  I have edited the above note for clarity and accuracy Ruled out for ectopic, live IUP measuring [redacted]w[redacted]d on [place] Reassured patient that pain likely due to combination of known constipation, early pregnancy, possibly contribution from fibroid Recommend initiating prenatal care Patient also requested medicine for nausea and constipation which was sent to her pharmacy  Venora Maples, MD/MPH Center for [employer] (Faculty Practice) 11/11/2022, 8:16 PM

## 2022-11-11 NOTE — MAU Note (Signed)
Jill Burke is a 37 y.o. at Unknown here in MAU reporting: she's concerned that she may have an ectopic pregnancy secondary constant stabbing pain and cramping in lower abdomen.  Also c/o constant lower back pain with intermittent right flank pain.  States has "intense" nausea but no vomiting. Also reports pain in tailbone since constipation last Thursday.  Denies VB LMP: 09/20/2022, reports +HPT Onset of complaint: last Thursday Pain score: 8 Vitals:   11/11/22 1553  BP: 107/63  Pulse: 84  Resp: 18  Temp: 98.3 F (36.8 C)  SpO2: 97%     FHT:NA Lab orders placed from triage:   UPT

## 2022-12-16 ENCOUNTER — Other Ambulatory Visit (HOSPITAL_COMMUNITY)
Admission: RE | Admit: 2022-12-16 | Discharge: 2022-12-16 | Disposition: A | Discharge status: Home / Self Care | Payer: Medicaid Other | Source: Ambulatory Visit | Attending: Obstetrics and Gynecology | Admitting: Obstetrics and Gynecology

## 2022-12-16 ENCOUNTER — Ambulatory Visit (INDEPENDENT_AMBULATORY_CARE_PROVIDER_SITE_OTHER): Payer: Medicaid Other | Admitting: Obstetrics and Gynecology

## 2022-12-16 ENCOUNTER — Encounter: Payer: Self-pay | Admitting: Obstetrics and Gynecology

## 2022-12-16 ENCOUNTER — Other Ambulatory Visit: Payer: Self-pay

## 2022-12-16 VITALS — BP 109/72 | HR 70 | Wt 155.8 lb

## 2022-12-16 DIAGNOSIS — N898 Other specified noninflammatory disorders of vagina: Secondary | ICD-10-CM

## 2022-12-16 DIAGNOSIS — F410 Panic disorder [episodic paroxysmal anxiety] without agoraphobia: Secondary | ICD-10-CM | POA: Diagnosis not present

## 2022-12-16 NOTE — Progress Notes (Signed)
    GYNECOLOGY VISIT  Patient name: Jill Burke MRN [id number]  Date of birth: 1985-12-06 Chief Complaint:   Vaginitis   History:  Jill Burke is a 37 y.o. [phone] being seen today for follow up .  Did medication abortion and had bleeding and then stopped bleeding about 3 days ago. No fever or chills. Not sure if she has had abnormal discharge. Went to follow up on Monday and just had urine test. Has had HA and mental changes. Attempted to call psych but they don't take medicaid.   Had a TAB 2 weeks ago and not bleeding and currently feeling irritated and uncomfortable. No longer having pain since having TAB - feels pregnancy made her pain worse. Has vaginal discomfort.   Past Medical History:  Diagnosis Date   Anxiety    Chlamydia 2007   treated   Lump, breast    Panic attack     Past Surgical History:  Procedure Laterality Date   ECTOPIC PREGNANCY SHOTS      The following portions of the patient's history were reviewed and updated as appropriate: allergies, current medications, past family history, past medical history, past social history, past surgical history and problem list.   Health Maintenance:   Last pap     Component Value Date/Time   DIAGPAP  01/02/2021 1425    - Negative for intraepithelial lesion or malignancy (NILM)   HPVHIGH Negative 01/02/2021 1425   ADEQPAP  01/02/2021 1425    Satisfactory for evaluation; transformation zone component PRESENT.    High Risk HPV: Positive  Adequacy:  Satisfactory for evaluation, transformation zone component PRESENT  Diagnosis:  Atypical squamous cells of undetermined significance (ASC-US)  Last mammogram: n/a   Review of Systems:  Pertinent items are noted in HPI. Comprehensive review of systems was otherwise negative.   Objective:  Physical Exam BP 109/72   Pulse 70   Wt 155 lb 12.8 oz (70.7 kg)   LMP 09/20/2022 Comment: SAB  Breastfeeding No   BMI 28.04 kg/m    Physical Exam Vitals and nursing  note reviewed.  Constitutional:      Appearance: Normal appearance.  HENT:     Head: Normocephalic and atraumatic.  Pulmonary:     Effort: Pulmonary effort is normal.  Skin:    General: Skin is warm and dry.  Neurological:     General: No focal deficit present.     Mental Status: She is alert.  Psychiatric:        Mood and Affect: Mood normal.        Behavior: Behavior normal.        Thought Content: Thought content normal.        Judgment: Judgment normal.        Assessment & Plan:   1. Vaginal irritation Vaginitis swab - Cervicovaginal ancillary only  2. Panic attacks Referral to psychiatry for anxiety management - Ambulatory referral to Psychiatry  Routine preventative health maintenance measures emphasized.  Lorriane Shire, MD Minimally Invasive Gynecologic Surgery Center for [facility], [facility]

## 2023-03-08 ENCOUNTER — Ambulatory Visit (HOSPITAL_COMMUNITY): Payer: Medicaid Other | Admitting: Mental Health

## 2023-03-08 ENCOUNTER — Encounter (HOSPITAL_COMMUNITY): Payer: Self-pay

## 2023-03-23 NOTE — Progress Notes (Unsigned)
Psychiatric Initial Adult Assessment  Patient Identification: Jill Burke MRN:  [id number] Date of Evaluation:  03/23/2023 Referral Source: Norva Riffle, PA  Assessment:  Jill Burke is a 37 y.o. female with a history of *** HLD, and migraines who presents to [facility] via video conferencing for initial evaluation of ***.  Patient reports ***  Plan:  # *** Past medication trials:  Status of problem: *** Interventions: -- ***  # *** Past medication trials:  Status of problem: *** Interventions: -- ***  # *** Past medication trials:  Status of problem: *** Interventions: -- ***  Patient was given contact information for behavioral health clinic and was instructed to call 911 for emergencies.   Subjective:  Chief Complaint: No chief complaint on file.   History of Present Illness:  ***  Chart review: -- Referred by Ob/Gyn August 2024 for panic attacks   Labs: tsh  Medical abortion AUgust  Past Psychiatric History:  Diagnoses: *** Medication trials: ***hydroxyzine, Xanax Previous psychiatrist/therapist: *** Hospitalizations: *** Suicide attempts: *** SIB: *** Hx of violence towards others: *** Current access to guns: *** Hx of trauma/abuse: ***  Previous Psychotropic Medications: {YES/NO:21197}  Substance Abuse History in the last 12 months:  {yes no:314532}  Past Medical History:  Past Medical History:  Diagnosis Date   Anxiety    Chlamydia 2007   treated   Lump, breast    Panic attack     Past Surgical History:  Procedure Laterality Date   ECTOPIC PREGNANCY SHOTS      Family Psychiatric History: ***  Family History:  Family History  Problem Relation Age of Onset   COPD Mother    Hypertension Father    Heart disease Father    Hyperlipidemia Father    Stroke Father    Heart attack Father    Cancer Maternal Grandmother    Liver disease Maternal Grandfather        alcoholic   Diabetes Paternal  Grandmother     Social History:   Academic/Vocational: ***  Social History   Socioeconomic History   Marital status: Single    Spouse name: Not on file   Number of children: 3   Years of education: 12   Highest education level: Not on file  Occupational History   Occupation: Software engineer    Employer: [employer]  Tobacco Use   Smoking status: Some Days    Current packs/day: 0.50    Types: Cigarettes   Smokeless tobacco: Never  Vaping Use   Vaping status: Never Used  Substance and Sexual Activity   Alcohol use: No    Alcohol/week: 0.0 standard drinks of alcohol   Drug use: Not Currently    Types: Marijuana   Sexual activity: Yes  Other Topics Concern   Not on file  Social History Narrative   Not on file   Social Determinants of Health   Financial Resource Strain: Not on file  Food Insecurity: No Food Insecurity (07/09/2022)   Hunger Vital Sign    Worried About Running Out of Food in the Last Year: Never true    Ran Out of Food in the Last Year: Never true  Transportation Needs: No Transportation Needs (07/09/2022)   PRAPARE - Administrator, Civil Service (Medical): No    Lack of Transportation (Non-Medical): No  Physical Activity: Not on file  Stress: Not on file  Social Connections: Unknown (09/19/2021)   Received from [facility], [facility]   Social  Network    Social Network: Not on file    Additional Social History: updated  Allergies:   Allergies  Allergen Reactions   Latex Dermatitis   Soap Dermatitis    Tide Clothes Soap Specifically    Current Medications: Current Outpatient Medications  Medication Sig Dispense Refill   acetaminophen (TYLENOL) 325 MG tablet Take 2 tablets (650 mg total) by mouth every 6 (six) hours as needed. (Patient not taking: Reported on 12/16/2022) 100 tablet 0   cyclobenzaprine (FLEXERIL) 10 MG tablet Take 0.5 tablets (5 mg total) by mouth 2 (two) times daily as needed for muscle spasms. (Patient not taking:  Reported on 12/16/2022) 20 tablet 0   docusate sodium (COLACE) 50 MG capsule Take 50 mg by mouth 2 (two) times daily. (Patient not taking: Reported on 12/16/2022)     gabapentin (NEURONTIN) 100 MG capsule Take 100 mg by mouth 2 (two) times daily. (Patient not taking: Reported on 12/16/2022)     ondansetron (ZOFRAN-ODT) 4 MG disintegrating tablet Take 1 tablet (4 mg total) by mouth every 6 (six) hours as needed for nausea. (Patient not taking: Reported on 12/16/2022) 20 tablet 0   polyethylene glycol powder (GLYCOLAX/MIRALAX) 17 GM/SCOOP powder Take 17 g by mouth daily as needed. (Patient not taking: Reported on 12/16/2022) 510 g 1   promethazine (PHENERGAN) 25 MG tablet Take 1 tablet (25 mg total) by mouth every 6 (six) hours as needed for nausea or vomiting. (Patient not taking: Reported on 12/16/2022) 30 tablet 0   No current facility-administered medications for this visit.    ROS: Review of Systems  Objective:  Psychiatric Specialty Exam: There were no vitals taken for this visit.There is no height or weight on file to calculate BMI.  General Appearance: {Appearance:22683}  Eye Contact:  {BHH EYE CONTACT:22684}  Speech:  {Speech:22685}  Volume:  {Volume (PAA):22686}  Mood:  {BHH MOOD:22306}  Affect:  {Affect (PAA):22687}  Thought Content: {Thought Content:22690}   Suicidal Thoughts:  {ST/HT (PAA):22692}  Homicidal Thoughts:  {ST/HT (PAA):22692}  Thought Process:  {Thought Process (PAA):22688}  Orientation:  {BHH ORIENTATION (PAA):22689}    Memory: Grossly intact ***  Judgment:  {Judgement (PAA):22694}  Insight:  {Insight (PAA):22695}  Concentration:  {Concentration:21399}  Recall:  not formally assessed ***  Fund of Knowledge: {BHH GOOD/FAIR/POOR:22877}  Language: {BHH GOOD/FAIR/POOR:22877}  Psychomotor Activity:  {Psychomotor (PAA):22696}  Akathisia:  {BHH YES OR NO:22294}  AIMS (if indicated): {Desc; done/not:10129}  Assets:  {Assets (PAA):22698}  ADL's:  {BHH WNU'U:72536}   Cognition: {chl bhh cognition:304700322}  Sleep:  {BHH GOOD/FAIR/POOR:22877}   PE: General: sits comfortably in view of camera; no acute distress *** Pulm: no increased work of breathing on room air *** MSK: all extremity movements appear intact *** Neuro: no focal neurological deficits observed *** Gait & Station: unable to assess by video ***   Metabolic Disorder Labs: No results found for: "HGBA1C", "MPG" No results found for: "PROLACTIN" No results found for: "CHOL", "TRIG", "HDL", "CHOLHDL", "VLDL", "LDLCALC" Lab Results  Component Value Date   TSH 1.219 03/27/2014    Therapeutic Level Labs: No results found for: "LITHIUM" No results found for: "CBMZ" No results found for: "VALPROATE"  Screenings:  GAD-7    Flowsheet Row Office Visit from 12/16/2022 in Center for [employer] at [facility] Office Visit from 07/09/2022 in Center for [employer] at [facility] for [facility] Clinical Support from 06/30/2021 in [place] for [employer] at [facility] for Women Office Visit from 01/02/2021  in Center for [employer] at [facility] Clinical Support from 10/17/2020 in Center for [employer] at [facility]  Total GAD-7 Score 21 1 17 2 7       PHQ2-9    Flowsheet Row Office Visit from 12/16/2022 in [facility] at [facility] Office Visit from 07/09/2022 in Center for [employer] at [facility] Clinical Support from 06/30/2021 in [place] for [employer] at [facility] Office Visit from 01/02/2021 in [place] for [employer] at [facility] Clinical Support from 10/17/2020 in Center for [employer] at [facility] Women  PHQ-2 Total Score 6 1 2  0 2  PHQ-9 Total Score 24 4 8 2 7        Collaboration of Care: Collaboration of Care: [facility] OP Collaboration of  Care:21014065}  Patient/Guardian was advised Release of Information must be obtained prior to any record release in order to collaborate their care with an outside provider. Patient/Guardian was advised if they have not already done so to contact the registration department to sign all necessary forms in order for [place] to release information regarding their care.   Consent: Patient/Guardian gives verbal consent for treatment and assignment of benefits for services provided during this visit. Patient/Guardian expressed understanding and agreed to proceed.   Televisit via video: I connected with Keydi A Ascher on 03/23/23 at 10:00 AM EST by a video enabled telemedicine application and verified that I am speaking with the correct person using two identifiers.  Location: Patient: *** Provider: remote office in [place]   I discussed the limitations of evaluation and management by telemedicine and the availability of in person appointments. The patient expressed understanding and agreed to proceed.  I discussed the assessment and treatment plan with the patient. The patient was provided an opportunity to ask questions and all were answered. The patient agreed with the plan and demonstrated an understanding of the instructions.   The patient was advised to call back or seek an in-person evaluation if the symptoms worsen or if the condition fails to improve as anticipated.  I provided *** minutes dedicated to the care of this patient via video on the date of this encounter to include chart review, face-to-face time with the patient, medication management/counseling ***.  Linnet Bottari A Adalynn Corne 11/12/202411:23 AM

## 2023-03-24 ENCOUNTER — Encounter (HOSPITAL_COMMUNITY): Payer: Medicaid Other | Admitting: Psychiatry

## 2023-03-24 ENCOUNTER — Encounter (HOSPITAL_COMMUNITY): Payer: Self-pay

## 2023-04-12 ENCOUNTER — Ambulatory Visit (HOSPITAL_COMMUNITY): Payer: Medicaid Other | Admitting: Mental Health

## 2023-05-20 ENCOUNTER — Other Ambulatory Visit (HOSPITAL_COMMUNITY)
Admission: RE | Admit: 2023-05-20 | Discharge: 2023-05-20 | Disposition: A | Discharge status: Home / Self Care | Payer: Medicaid Other | Source: Ambulatory Visit | Attending: Obstetrics and Gynecology | Admitting: Obstetrics and Gynecology

## 2023-05-20 ENCOUNTER — Ambulatory Visit: Payer: Medicaid Other | Admitting: Obstetrics and Gynecology

## 2023-05-20 ENCOUNTER — Encounter: Payer: Self-pay | Admitting: Obstetrics and Gynecology

## 2023-05-20 ENCOUNTER — Other Ambulatory Visit: Payer: Self-pay

## 2023-05-20 VITALS — BP 126/85 | HR 72 | Wt 175.5 lb

## 2023-05-20 DIAGNOSIS — D219 Benign neoplasm of connective and other soft tissue, unspecified: Secondary | ICD-10-CM | POA: Diagnosis not present

## 2023-05-20 DIAGNOSIS — Z113 Encounter for screening for infections with a predominantly sexual mode of transmission: Secondary | ICD-10-CM | POA: Diagnosis not present

## 2023-05-20 DIAGNOSIS — Z3009 Encounter for other general counseling and advice on contraception: Secondary | ICD-10-CM

## 2023-05-20 DIAGNOSIS — Z01419 Encounter for gynecological examination (general) (routine) without abnormal findings: Secondary | ICD-10-CM | POA: Diagnosis not present

## 2023-05-20 MED ORDER — NORETHINDRONE 0.35 MG PO TABS: 1.0000 | ORAL_TABLET | Freq: Every day | ORAL | 11 refills | Status: DC

## 2023-05-20 NOTE — Progress Notes (Signed)
 ANNUAL EXAM Patient name: Jill Burke MRN [id number]  Date of birth: 09/23/1985 Chief Complaint:   Gynecologic Exam (/)  History of Present Illness:   Jill Burke is a 38 y.o. [phone] being seen today for a routine annual exam.  Current complaints:  wants to resume birth control, marks on her breast, and vulvar bumps, STI testing   Menstrual concerns? No   Breast or nipple changes? Yes light mark and cherry mark on left breast, no lumps within the breast, no nipple discharge  Contraception use? Yes wants to resume OCP - resumed a prior prescription and ran out  Sexually active? Yes   Having 1.5 years of having a lump on groin/pantiliner and the ones from last year were large and had pus. Rarely shaves, but if she does it would be razor, has not shaved for months. Notes more razor use previously  Concerned about fibroid and wanting to check on it, no changes in menses other than change when attempting to resume birth control  No CHC contraindications: hx of migraines, no aura; uses tobacco products   No LMP recorded.   The pregnancy intention screening data noted above was reviewed. Potential methods of contraception were discussed. The patient elected to proceed with No data recorded.   Last pap     Component Value Date/Time   DIAGPAP  01/02/2021 1425    - Negative for intraepithelial lesion or malignancy (NILM)   HPVHIGH Negative 01/02/2021 1425   ADEQPAP  01/02/2021 1425    Satisfactory for evaluation; transformation zone component PRESENT.    Last mammogram: N/A.  Last colonoscopy: n/a.      12/16/2022   12:09 PM 07/09/2022    1:29 PM 06/30/2021    4:04 PM 01/02/2021    2:24 PM 10/17/2020    3:01 PM  Depression screen PHQ 2/9  Decreased Interest 3 1 1  0 1  Down, Depressed, Hopeless 3 0 1 0 1  PHQ - 2 Score 6 1 2  0 2  Altered sleeping 3 0 3 0 1  Tired, decreased energy 3 3 2 1 1   Change in appetite 3 0 1 1 1   Feeling bad or failure about yourself  3 0 0 0  0  Trouble concentrating 3 0 0 0 1  Moving slowly or fidgety/restless 3 0 0 0 1  Suicidal thoughts 0 0 0 0 0  PHQ-9 Score 24 4 8 2 7   Difficult doing work/chores     Not difficult at all        12/16/2022   12:09 PM 07/09/2022    1:29 PM 06/30/2021    4:04 PM 01/02/2021    2:24 PM  GAD 7 : Generalized Anxiety Score  Nervous, Anxious, on Edge 3 0 3 1  Control/stop worrying 3 0 3 0  Worry too much - different things 3 0 3 0  Trouble relaxing 3 1 3  0  Restless 3 0 3 0  Easily annoyed or irritable 3 0 2 1  Afraid - awful might happen 3 0 0 0  Total GAD 7 Score 21 1 17 2      Review of Systems:   Pertinent items are noted in HPI Denies any headaches, blurred vision, fatigue, shortness of breath, chest pain, abdominal pain, abnormal vaginal discharge/itching/odor/irritation, problems with periods, bowel movements, urination, or intercourse unless otherwise stated above. Pertinent History Reviewed:  Reviewed past medical,surgical, social and family history.  Reviewed problem list, medications and allergies. Physical Assessment:  Vitals:   05/20/23 1115 05/20/23 1135  BP: (!) 146/88 126/85  Pulse: 78 72  Weight: 175 lb 8 oz (79.6 kg)   There is no height or weight on file to calculate BMI.        Physical Examination:   General appearance - well appearing, and in no distress  Mental status - alert, oriented to person, place, and time  Psych:  She has a normal mood and affect  Skin - warm and dry, normal color, no suspicious lesions noted  Chest - effort normal, all lung fields clear to auscultation bilaterally  Heart - normal rate and regular rhythm  Breasts - breasts appear normal, no suspicious masses, no skin or nipple changes or  axillary nodes  Abdomen - soft, nontender, nondistended, no masses or organomegaly  Pelvic -  VULVA: normal appearing vulva with no masses, tenderness or lesions with what appears to be well healed scar without cyst or fluctuance, no erythema or  tenderness VAGINA: normal appearing vagina with normal color and discharge, no lesions    Extremities:  No swelling or varicosities noted  Chaperone present for exam  No results found for this or any previous visit (from the past 24 hours).    Assessment & Plan:  1. Well woman exam with routine gynecological exam (Primary) - Cervical cancer screening: Discussed guidelines. Pap with HPV due 12/2023 at earliest - GC/CT: accepts - Birth Control: POPs - CHC contraindicated due to tobacco use - Breast Health: Encouraged self breast awareness/SBE. Teaching provided. Recommend mentioning marks to dermatologist at upcoming appointment. No masses appreciated on exam.  - F/U 12 months and prn   2. Encounter for counseling regarding contraception Reviewed all forms of birth control options available including abstinence; over the counter/barrier methods; hormonal contraceptive medication including pill, patch, ring, injection,contraceptive implant; hormonal and nonhormonal IUDs; permanent sterilization options including vasectomy and the various tubal sterilization modalities. Risks and benefits reviewed.  Questions were answered.  Information was given to patient to review.   - norethindrone  (MICRONOR ) 0.35 MG tablet; Take 1 tablet (0.35 mg total) by mouth daily.  Dispense: 28 tablet; Refill: 11  3. Fibroid Pelvic US  ordered for fibroid surveillance.  - US  PELVIC COMPLETE WITH TRANSVAGINAL; Future  4. Screening examination for STI - RPR+HBsAg+HCVAb+... - Cervicovaginal ancillary only  No orders of the defined types were placed in this encounter.   Meds: No orders of the defined types were placed in this encounter.   Follow-up: No follow-ups on file.  Carter Quarry, MD 05/20/2023 8:34 AM

## 2023-05-21 ENCOUNTER — Other Ambulatory Visit: Payer: Self-pay | Admitting: Obstetrics and Gynecology

## 2023-05-21 DIAGNOSIS — J329 Chronic sinusitis, unspecified: Secondary | ICD-10-CM

## 2023-05-21 DIAGNOSIS — B3731 Acute candidiasis of vulva and vagina: Secondary | ICD-10-CM

## 2023-05-21 HISTORY — DX: Chronic sinusitis, unspecified: J32.9

## 2023-05-21 LAB — CERVICOVAGINAL ANCILLARY ONLY
Bacterial Vaginitis (gardnerella): NEGATIVE
Candida Glabrata: NEGATIVE
Candida Vaginitis: POSITIVE — AB
Chlamydia: NEGATIVE
Comment: NEGATIVE
Comment: NEGATIVE
Comment: NEGATIVE
Comment: NEGATIVE
Comment: NEGATIVE
Comment: NORMAL
Neisseria Gonorrhea: NEGATIVE
Trichomonas: NEGATIVE

## 2023-05-21 MED ORDER — FLUCONAZOLE 150 MG PO TABS: 150.0000 mg | ORAL_TABLET | Freq: Once | ORAL | 1 refills | Status: AC

## 2023-05-22 LAB — RPR+HBSAG+HCVAB+...
HIV Screen 4th Generation wRfx: NONREACTIVE
Hep C Virus Ab: NONREACTIVE
Hepatitis B Surface Ag: NEGATIVE
RPR Ser Ql: NONREACTIVE

## 2023-06-08 ENCOUNTER — Encounter: Payer: Self-pay | Admitting: Obstetrics and Gynecology

## 2023-07-02 ENCOUNTER — Ambulatory Visit: Payer: Medicaid Other

## 2023-07-02 ENCOUNTER — Other Ambulatory Visit (HOSPITAL_COMMUNITY)
Admission: RE | Admit: 2023-07-02 | Discharge: 2023-07-02 | Disposition: A | Discharge status: Home / Self Care | Payer: Medicaid Other | Source: Ambulatory Visit | Attending: Family Medicine | Admitting: Family Medicine

## 2023-07-02 ENCOUNTER — Other Ambulatory Visit: Payer: Self-pay

## 2023-07-02 VITALS — BP 124/82 | HR 78 | Ht 62.5 in | Wt 175.5 lb

## 2023-07-02 DIAGNOSIS — N926 Irregular menstruation, unspecified: Secondary | ICD-10-CM

## 2023-07-02 DIAGNOSIS — Z202 Contact with and (suspected) exposure to infections with a predominantly sexual mode of transmission: Secondary | ICD-10-CM

## 2023-07-02 DIAGNOSIS — N898 Other specified noninflammatory disorders of vagina: Secondary | ICD-10-CM | POA: Insufficient documentation

## 2023-07-02 DIAGNOSIS — Z3200 Encounter for pregnancy test, result unknown: Secondary | ICD-10-CM

## 2023-07-02 LAB — POCT PREGNANCY, URINE: Preg Test, Ur: NEGATIVE

## 2023-07-02 NOTE — Progress Notes (Signed)
States she was treated for yeast twice in January but is having vaginal irritation, and  whitish vaginal discharge and unusual smell. Also reports unprotected intercourse and wants to be sure doesn't have std. Self Swab done. Also wants to do UPT because feels very tired. States had antibiotics in January and started ocp's in January and took part of first pack  and hasn't started seond pack . States stopped taking ocp's after about 3 weeks. UPT done and results negative. She reports nausea and tiredness. Serum pregnancy test ordered. I  explained if wet prep results positive she will be notified of results and treatment. She voices understanding. Nancy Fetter

## 2023-07-03 ENCOUNTER — Encounter: Payer: Self-pay | Admitting: Family Medicine

## 2023-07-03 LAB — BETA HCG QUANT (REF LAB): hCG Quant: <1 m[IU]/mL

## 2023-07-05 LAB — CERVICOVAGINAL ANCILLARY ONLY
Bacterial Vaginitis (gardnerella): NEGATIVE
Candida Glabrata: NEGATIVE
Candida Vaginitis: NEGATIVE
Chlamydia: NEGATIVE
Comment: NEGATIVE
Comment: NEGATIVE
Comment: NEGATIVE
Comment: NEGATIVE
Comment: NEGATIVE
Comment: NORMAL
Neisseria Gonorrhea: NEGATIVE
Trichomonas: NEGATIVE

## 2023-07-09 ENCOUNTER — Encounter: Payer: Self-pay | Admitting: Obstetrics and Gynecology

## 2023-07-27 ENCOUNTER — Other Ambulatory Visit: Payer: Self-pay | Admitting: Physician Assistant

## 2023-07-27 DIAGNOSIS — Z1231 Encounter for screening mammogram for malignant neoplasm of breast: Secondary | ICD-10-CM

## 2023-07-28 ENCOUNTER — Ambulatory Visit (HOSPITAL_COMMUNITY)
Admission: RE | Admit: 2023-07-28 | Discharge: 2023-07-28 | Disposition: A | Discharge status: Home / Self Care | Source: Ambulatory Visit | Attending: Obstetrics and Gynecology | Admitting: Obstetrics and Gynecology

## 2023-07-28 DIAGNOSIS — D219 Benign neoplasm of connective and other soft tissue, unspecified: Secondary | ICD-10-CM | POA: Diagnosis present

## 2023-08-02 ENCOUNTER — Other Ambulatory Visit: Payer: Self-pay | Admitting: Physician Assistant

## 2023-08-02 ENCOUNTER — Encounter: Payer: Self-pay | Admitting: Physician Assistant

## 2023-08-02 DIAGNOSIS — Z1231 Encounter for screening mammogram for malignant neoplasm of breast: Secondary | ICD-10-CM

## 2023-08-05 ENCOUNTER — Encounter: Payer: Self-pay | Admitting: *Deleted

## 2023-08-05 ENCOUNTER — Telehealth: Payer: Self-pay

## 2023-08-05 ENCOUNTER — Other Ambulatory Visit: Payer: Self-pay | Admitting: Physician Assistant

## 2023-08-05 DIAGNOSIS — N6321 Unspecified lump in the left breast, upper outer quadrant: Secondary | ICD-10-CM

## 2023-08-05 NOTE — Telephone Encounter (Signed)
 Patient called the RN line returning a phone call she received regarding a copy of her radiology images that was scheduled 1 week ago.    Marcelino Duster, RN

## 2023-08-05 NOTE — Telephone Encounter (Signed)
 Called pt and discussed her concern. She reports that her ultrasound from 3/19 does not have results reported. Per chart review, the study has not yet been read by Radiologist. I advised pt that I will place a call to expedite the final report. Pt then stated that she has severe vaginal irritation and pain. She feels this may be from increased episodes of intercourse recently. Pt is concerned that she may have some tears in vagina although did not report bleeding. Pt was encouraged to decrease frequency of sexual activity. She will be scheduled for appointment in office and will receive a Mychart message. Pt voiced understanding. Following the call, I contacted [facility] and requested her study to be placed on the escalated list for reading - spoke with Gabe. Pt was scheduled for office appt on 4/2 @ 1:55 pm. Mychart message sent with this information.

## 2023-08-06 ENCOUNTER — Encounter: Payer: Self-pay | Admitting: Obstetrics and Gynecology

## 2023-08-11 ENCOUNTER — Other Ambulatory Visit: Payer: Self-pay

## 2023-08-11 ENCOUNTER — Ambulatory Visit: Admitting: Obstetrics and Gynecology

## 2023-08-11 ENCOUNTER — Other Ambulatory Visit (HOSPITAL_COMMUNITY)
Admission: RE | Admit: 2023-08-11 | Discharge: 2023-08-11 | Disposition: A | Discharge status: Home / Self Care | Source: Ambulatory Visit | Attending: Obstetrics and Gynecology | Admitting: Obstetrics and Gynecology

## 2023-08-11 ENCOUNTER — Encounter: Payer: Self-pay | Admitting: Obstetrics and Gynecology

## 2023-08-11 VITALS — BP 122/83 | HR 71 | Ht 62.5 in | Wt 171.5 lb

## 2023-08-11 DIAGNOSIS — Z3202 Encounter for pregnancy test, result negative: Secondary | ICD-10-CM | POA: Diagnosis not present

## 2023-08-11 DIAGNOSIS — N898 Other specified noninflammatory disorders of vagina: Secondary | ICD-10-CM

## 2023-08-11 DIAGNOSIS — Z8619 Personal history of other infectious and parasitic diseases: Secondary | ICD-10-CM

## 2023-08-11 DIAGNOSIS — N941 Unspecified dyspareunia: Secondary | ICD-10-CM

## 2023-08-11 DIAGNOSIS — Z3189 Encounter for other procreative management: Secondary | ICD-10-CM

## 2023-08-11 DIAGNOSIS — Z113 Encounter for screening for infections with a predominantly sexual mode of transmission: Secondary | ICD-10-CM

## 2023-08-11 LAB — POCT PREGNANCY, URINE: Preg Test, Ur: NEGATIVE

## 2023-08-11 MED ORDER — VALACYCLOVIR HCL 500 MG PO TABS
500.0000 mg | ORAL_TABLET | Freq: Two times a day (BID) | ORAL | 1 refills | Status: DC
Start: 2023-08-11 — End: 2023-12-27

## 2023-08-11 MED ORDER — NYSTATIN 100000 UNIT/GM EX CREA: TOPICAL_CREAM | CUTANEOUS | 1 refills | Status: DC

## 2023-08-11 MED ORDER — PREPLUS 27-1 MG PO TABS: 1.0000 | ORAL_TABLET | Freq: Every day | ORAL | 13 refills | Status: DC

## 2023-08-11 MED ORDER — TRIAMCINOLONE ACETONIDE 0.1 % EX CREA: 1.0000 | TOPICAL_CREAM | Freq: Two times a day (BID) | CUTANEOUS | 0 refills | Status: DC

## 2023-08-11 NOTE — Progress Notes (Signed)
   GYNECOLOGY PROGRESS NOTE  History:  38 y.o. [id number] presents to [facility] for problem visit. Seen 2/21 d/t vaginal irritation. Irritation has continued, has not been sexually active last few days. Reports she feels like skin separated. Right lower abd and low back.  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 Last pap smear on 01/02/21 was normal, neg HRHPV.  Health Maintenance Due  Topic Date Due   Pneumococcal Vaccine 37-17 Years old (1 of 2 - PCV) Never done   COVID-19 Vaccine (1 - 2024-25 season) Never done     Review of Systems:  Pertinent items are noted in HPI.   Objective:  Physical Exam There were no vitals taken for this visit. VS reviewed, nursing note reviewed,  Constitutional: well developed, well nourished, no distress HEENT: normocephalic CV: normal rate Pulm/chest wall: normal effort Breast Exam: deferred Abdomen: soft Neuro: alert and oriented x 3 Skin: warm, dry Psych: affect normal Pelvic exam: Cervix pink, visually closed, without lesion, scant blood, vaginal walls and external genitalia normal evidence of 1mm tear vulva, mild erythema  Assessment & Plan:  1. Vaginal irritation (Primary) Trial of cream for irritation.  To follow up if no relief' Discussed lubrication  - Cervicovaginal ancillary only( [place]) - Prenatal Vit-Fe Fumarate-FA (PREPLUS) 27-1 MG TABS; Take 1 tablet by mouth daily.  Dispense: 30 tablet; Refill: 13 - Urine Culture - nystatin cream (MYCOSTATIN); Apply thin layer BID Dispense: 30 g; Refill: 1 - triamcinolone cream (KENALOG) 0.1 %; Apply 1 Application  topically 2 (two) times daily.  Dispense: 30 g; Refill: 0  2. Screening for STD (sexually transmitted disease) Desires repeat screen - HIV Antibody (routine testing w rflx) - RPR - Hepatitis B Surface AntiGEN - Hepatitis C Antibody  3. Dyspareunia in female Normal recent pelvic u/s  Discussed pelvic floor PT, referral placed today - Ambulatory referral to Physical Therapy  4. History of herpes simplex infection Rx needed prior to microneedling appt - valACYclovir (VALTREX) 500 MG tablet; Take 1 tablet (500 mg total) by mouth 2 (two) times daily. Take twice a day for 5 day  Dispense: 30 tablet; Refill: 1  5. Encounter for fertility planning Encouraged PNV, rx sent  - Prenatal Vit-Fe Fumarate-FA (PREPLUS) 27-1 MG TABS; Take 1 tablet by mouth daily.  Dispense: 30 tablet; Refill: 13    Albertine Grates, FNP

## 2023-08-12 LAB — CERVICOVAGINAL ANCILLARY ONLY
Bacterial Vaginitis (gardnerella): NEGATIVE
Candida Glabrata: NEGATIVE
Candida Vaginitis: NEGATIVE
Comment: NEGATIVE
Comment: NEGATIVE
Comment: NEGATIVE

## 2023-08-12 LAB — HEPATITIS C ANTIBODY: Hep C Virus Ab: NONREACTIVE

## 2023-08-12 LAB — RPR: RPR Ser Ql: NONREACTIVE

## 2023-08-12 LAB — HIV ANTIBODY (ROUTINE TESTING W REFLEX): HIV Screen 4th Generation wRfx: NONREACTIVE

## 2023-08-12 LAB — HEPATITIS B SURFACE ANTIGEN: Hepatitis B Surface Ag: NEGATIVE

## 2023-08-13 LAB — POCT URINALYSIS DIP (DEVICE)
Bilirubin Urine: NEGATIVE
Glucose, UA: 100 mg/dL — AB
Ketones, ur: NEGATIVE mg/dL
Leukocytes,Ua: NEGATIVE
Nitrite: NEGATIVE
Protein, ur: NEGATIVE mg/dL
Specific Gravity, Urine: >=1.03 (ref 1.005–1.030)
Urobilinogen, UA: 0.2 mg/dL (ref 0.0–1.0)
pH: 6 (ref 5.0–8.0)

## 2023-08-13 LAB — URINE CULTURE

## 2023-08-16 ENCOUNTER — Other Ambulatory Visit: Payer: Self-pay | Admitting: Physician Assistant

## 2023-08-16 DIAGNOSIS — R1011 Right upper quadrant pain: Secondary | ICD-10-CM

## 2023-08-19 ENCOUNTER — Other Ambulatory Visit

## 2023-08-30 ENCOUNTER — Ambulatory Visit
Admission: RE | Admit: 2023-08-30 | Discharge: 2023-08-30 | Disposition: A | Discharge status: Home / Self Care | Source: Ambulatory Visit | Attending: Physician Assistant | Admitting: Physician Assistant

## 2023-08-30 DIAGNOSIS — R1011 Right upper quadrant pain: Secondary | ICD-10-CM

## 2023-08-31 ENCOUNTER — Ambulatory Visit: Admitting: Obstetrics and Gynecology

## 2023-09-02 ENCOUNTER — Ambulatory Visit
Admission: RE | Admit: 2023-09-02 | Discharge: 2023-09-02 | Disposition: A | Discharge status: Home / Self Care | Source: Ambulatory Visit | Attending: Physician Assistant | Admitting: Physician Assistant

## 2023-09-02 ENCOUNTER — Other Ambulatory Visit: Payer: Self-pay | Admitting: Physician Assistant

## 2023-09-02 DIAGNOSIS — N6321 Unspecified lump in the left breast, upper outer quadrant: Secondary | ICD-10-CM

## 2023-09-02 DIAGNOSIS — N6310 Unspecified lump in the right breast, unspecified quadrant: Secondary | ICD-10-CM

## 2023-09-07 ENCOUNTER — Encounter: Payer: Self-pay | Admitting: Obstetrics and Gynecology

## 2023-09-22 ENCOUNTER — Emergency Department (HOSPITAL_BASED_OUTPATIENT_CLINIC_OR_DEPARTMENT_OTHER)
Admission: EM | Admit: 2023-09-22 | Discharge: 2023-09-22 | Disposition: A | Discharge status: Home / Self Care | Attending: Emergency Medicine | Admitting: Emergency Medicine

## 2023-09-22 ENCOUNTER — Emergency Department (HOSPITAL_BASED_OUTPATIENT_CLINIC_OR_DEPARTMENT_OTHER)

## 2023-09-22 ENCOUNTER — Emergency Department (HOSPITAL_BASED_OUTPATIENT_CLINIC_OR_DEPARTMENT_OTHER): Admitting: Radiology

## 2023-09-22 ENCOUNTER — Other Ambulatory Visit: Payer: Self-pay

## 2023-09-22 ENCOUNTER — Encounter (HOSPITAL_BASED_OUTPATIENT_CLINIC_OR_DEPARTMENT_OTHER): Payer: Self-pay | Admitting: Emergency Medicine

## 2023-09-22 DIAGNOSIS — D259 Leiomyoma of uterus, unspecified: Secondary | ICD-10-CM | POA: Diagnosis not present

## 2023-09-22 DIAGNOSIS — Z91040 Latex allergy status: Secondary | ICD-10-CM | POA: Diagnosis not present

## 2023-09-22 DIAGNOSIS — E119 Type 2 diabetes mellitus without complications: Secondary | ICD-10-CM | POA: Diagnosis not present

## 2023-09-22 DIAGNOSIS — D751 Secondary polycythemia: Secondary | ICD-10-CM | POA: Insufficient documentation

## 2023-09-22 DIAGNOSIS — R0789 Other chest pain: Secondary | ICD-10-CM | POA: Diagnosis not present

## 2023-09-22 DIAGNOSIS — R102 Pelvic and perineal pain: Secondary | ICD-10-CM

## 2023-09-22 DIAGNOSIS — I2510 Atherosclerotic heart disease of native coronary artery without angina pectoris: Secondary | ICD-10-CM | POA: Diagnosis not present

## 2023-09-22 DIAGNOSIS — R1030 Lower abdominal pain, unspecified: Secondary | ICD-10-CM | POA: Diagnosis present

## 2023-09-22 HISTORY — DX: Pure hypercholesterolemia, unspecified: E78.00

## 2023-09-22 HISTORY — DX: Type 2 diabetes mellitus without complications: E11.9

## 2023-09-22 LAB — CBC
HCT: 44.2 % (ref 36.0–46.0)
Hemoglobin: 15.6 g/dL — ABNORMAL HIGH (ref 12.0–15.0)
MCH: 32.4 pg (ref 26.0–34.0)
MCHC: 35.3 g/dL (ref 30.0–36.0)
MCV: 91.9 fL (ref 80.0–100.0)
Platelets: 253 10*3/uL (ref 150–400)
RBC: 4.81 MIL/uL (ref 3.87–5.11)
RDW: 12.2 % (ref 11.5–15.5)
WBC: 8 10*3/uL (ref 4.0–10.5)
nRBC: 0 % (ref 0.0–0.2)

## 2023-09-22 LAB — URINALYSIS, ROUTINE W REFLEX MICROSCOPIC
Bilirubin Urine: NEGATIVE
Glucose, UA: NEGATIVE mg/dL
Hgb urine dipstick: NEGATIVE
Ketones, ur: NEGATIVE mg/dL
Leukocytes,Ua: NEGATIVE
Nitrite: NEGATIVE
Protein, ur: NEGATIVE mg/dL
Specific Gravity, Urine: 1.006 (ref 1.005–1.030)
pH: 6 (ref 5.0–8.0)

## 2023-09-22 LAB — COMPREHENSIVE METABOLIC PANEL WITH GFR
ALT: 11 U/L (ref 0–44)
AST: 15 U/L (ref 15–41)
Albumin: 4.4 g/dL (ref 3.5–5.0)
Alkaline Phosphatase: 56 U/L (ref 38–126)
Anion gap: 10 (ref 5–15)
BUN: 12 mg/dL (ref 6–20)
CO2: 23 mmol/L (ref 22–32)
Calcium: 9.6 mg/dL (ref 8.9–10.3)
Chloride: 106 mmol/L (ref 98–111)
Creatinine, Ser: 0.77 mg/dL (ref 0.44–1.00)
GFR, Estimated: >60 mL/min (ref >60)
Glucose, Bld: 99 mg/dL (ref 70–99)
Potassium: 3.9 mmol/L (ref 3.5–5.1)
Sodium: 138 mmol/L (ref 135–145)
Total Bilirubin: 0.4 mg/dL (ref 0.0–1.2)
Total Protein: 6.7 g/dL (ref 6.5–8.1)

## 2023-09-22 LAB — PREGNANCY, URINE: Preg Test, Ur: NEGATIVE

## 2023-09-22 LAB — LIPASE, BLOOD: Lipase: 31 U/L (ref 11–51)

## 2023-09-22 MED ORDER — LIDOCAINE 5 % EX PTCH: 1.0000 | MEDICATED_PATCH | CUTANEOUS | 0 refills | Status: DC

## 2023-09-22 MED ORDER — IBUPROFEN 600 MG PO TABS: 600.0000 mg | ORAL_TABLET | Freq: Four times a day (QID) | ORAL | 0 refills | Status: DC | PRN

## 2023-09-22 MED ORDER — ACETAMINOPHEN 500 MG PO TABS
1000.0000 mg | ORAL_TABLET | Freq: Once | ORAL | Status: AC
  Administered 2023-09-22: 1000 mg via ORAL
  Filled 2023-09-22: qty 2

## 2023-09-22 NOTE — Discharge Instructions (Addendum)
 As discussed, your workup today was overall reassuring.  You do have a uterine fibroid on exam which could be causing your pelvic pain.  Your chest x-ray appeared normal.  A suspect you are having chest wall pain.  Will send you home with anti-inflammatories to treat the pain as well as numbing patches.  Recommend follow-up with your OB/GYN as well as your primary care for reassessment of your symptoms.  Please not hesitate to return to the emergency department if the worrisome signs and symptoms we discussed become apparent.

## 2023-09-22 NOTE — ED Triage Notes (Signed)
 Pelvic pain mainly to the right, bilateral shoulders and lumbar is hurting for 5 days. Yesterday headache started (has not taken any meds).   Pt also has a right lump she can feel on her right upper abdomen, this has starting hurting and now radiating to right shoulder blade and right breast

## 2023-09-22 NOTE — ED Provider Notes (Signed)
 [facility] EMERGENCY DEPARTMENT AT [facility] Provider Note   CSN: [id number] Arrival date & time: 09/22/23  1104     History  No chief complaint on file.   Jill Burke is a 38 y.o. female.  HPI   38 year old female presents emergency department with complaints of lower abdominal pain with radiation to the back.  States she has been having these symptoms for the past 6 to 7 days.  States she has a history of uterine fibroid as well as ectopic pregnancy and states this feels somewhat similar.  Denies any fever, urinary symptoms, vaginal symptoms, change in bowel habits.  Last menstrual period around a month ago but does not remember the exact date.  Patient also states that 3 days ago, developed right-sided chest discomfort.  States this is worsened with movement.  Denies any shortness of breath, cough.  Denies any history of DVT/PE, recent surgery/immobilization, known malignancy, known coagulopathy, hormonal therapy.  Past medical history significant for diabetes mellitus, hypercholesterolemia, CAD, breast lump  Home Medications Prior to Admission medications   Medication Sig Start Date End Date Taking? Authorizing Provider  nystatin  cream (MYCOSTATIN ) Apply thin layer after each breastfeeding or pumping session, Continue to use for 1-2 weeks after all symptoms have resolved. 08/11/23   Zelma Hidden, FNP  Prenatal Vit-Fe Fumarate-FA (PREPLUS) 27-1 MG TABS Take 1 tablet by mouth daily. 08/11/23   Zelma Hidden, FNP  triamcinolone  cream (KENALOG ) 0.1 % Apply 1 Application topically 2 (two) times daily. 08/11/23   Zelma Hidden, FNP  valACYclovir  (VALTREX ) 500 MG tablet Take 1 tablet (500 mg total) by mouth 2 (two) times daily. Take twice a day for 5 day 08/11/23   Zelma Hidden, FNP      Allergies    Latex and Soap    Review of Systems   Review of Systems  All other systems reviewed and are negative.   Physical Exam Updated Vital Signs BP (!) 127/90 (BP  Location: Right Arm)   Pulse 88   Temp 98.1 F (36.7 C)   Resp 20   Wt 76.6 kg   LMP 08/10/2023 Comment: pt shielded ksm  SpO2 100%   BMI 30.39 kg/m  Physical Exam Vitals and nursing note reviewed.  Constitutional:      General: She is not in acute distress.    Appearance: She is well-developed.  HENT:     Head: Normocephalic and atraumatic.  Eyes:     Conjunctiva/sclera: Conjunctivae normal.  Cardiovascular:     Rate and Rhythm: Normal rate and regular rhythm.     Heart sounds: No murmur heard. Pulmonary:     Effort: Pulmonary effort is normal. No respiratory distress.     Breath sounds: Normal breath sounds. No wheezing, rhonchi or rales.     Comments: Right-sided chest wall tenderness. Abdominal:     Palpations: Abdomen is soft.     Tenderness: There is abdominal tenderness. There is no right CVA tenderness, left CVA tenderness or guarding.     Comments: Suprapubic tenderness.  Left adnexal/left lower quadrant tenderness.  Musculoskeletal:        General: No swelling.     Cervical back: Neck supple.  Skin:    General: Skin is warm and dry.     Capillary Refill: Capillary refill takes less than 2 seconds.  Neurological:     Mental Status: She is alert.  Psychiatric:        Mood and Affect: Mood normal.  ED Results / Procedures / Treatments   Labs (all labs ordered are listed, but only abnormal results are displayed) Labs Reviewed  CBC - Abnormal; Notable for the following components:      Result Value   Hemoglobin 15.6 (*)    All other components within normal limits  LIPASE, BLOOD  COMPREHENSIVE METABOLIC PANEL WITH GFR  URINALYSIS, ROUTINE W REFLEX MICROSCOPIC  PREGNANCY, URINE    EKG None  Radiology No results found.  Procedures Procedures    Medications Ordered in ED Medications  acetaminophen  (TYLENOL ) tablet 1,000 mg (has no administration in time range)    ED Course/ Medical Decision Making/ A&P Clinical Course as of 09/22/23 1210   Wed Sep 22, 2023  1149 Lower abdominal pain 6 days. LMP 14th last month 3 days [CR]    Clinical Course User Index [CR] [place] Butter, PA                                 Medical Decision Making Amount and/or Complexity of Data Reviewed Labs: ordered. Radiology: ordered.  Risk OTC drugs. Prescription drug management.   This patient presents to the ED for concern of abdominal pain, this involves an extensive number of treatment options, and is a complaint that carries with it a high risk of complications and morbidity.  The differential diagnosis includes gastritis, PUD, cholecystitis, CBD pathology, SBO/abdomen volvulus, diverticulitis, appendicitis, Previously, ovarian torsion, tubo-ovarian abscess, menstruation, other   Co morbidities that complicate the patient evaluation  See HPI   Additional history obtained:  Additional history obtained from EMR External records from outside source obtained and reviewed including hospital records   Lab Tests:  I Ordered, and personally interpreted labs.  The pertinent results include: No leukocytosis.  Polycythemia 15.6.  Platelets within range.  No Electra abnormalities.  No transaminitis.  No renal dysfunction.  UA with no abnormality. Lipase within the limits.   Imaging Studies ordered:  I ordered imaging studies including chest x-ray, pelvic ultrasound I independently visualized and interpreted imaging which showed  Chest x-ray: No acute pulmonary abnormality. Pelvic ultrasound: Uterine fibroid.  No acute abnormality. I agree with the radiologist interpretation   Cardiac Monitoring: / EKG:  The patient was maintained on a cardiac monitor.  I personally viewed and interpreted the cardiac monitored which showed an underlying rhythm of: Sinus rhythm   Consultations Obtained:  N/a   Problem List / ED Course / Critical interventions / Medication management  Pelvic pain, right chest wall pain I ordered medication  including Tylenol    Reevaluation of the patient after these medicines showed that the patient improved I have reviewed the patients home medicines and have made adjustments as needed   Social Determinants of Health:  Tobacco use.  Denies illicit drug use.   Test / Admission - Considered:  Pelvic pain, right chest wall pain Vitals signs within normal range and stable throughout visit. Laboratory/imaging studies significant for: See above 38 year old female presents emergency department with complaints of lower abdominal pain with radiation to the back.  States she has been having these symptoms for the past 6 to 7 days.  States she has a history of uterine fibroid as well as ectopic pregnancy and states this feels somewhat similar.  Denies any fever, urinary symptoms, vaginal symptoms, change in bowel habits.  Last menstrual period around a month ago but does not remember the exact date.  Patient also states that 3  days ago, developed right-sided chest discomfort.  States this is worsened with movement.  Denies any shortness of breath, cough.  Denies any history of DVT/PE, recent surgery/immobilization, known malignancy, known coagulopathy, hormonal therapy. On exam, with reducible suprapubic/pelvic tenderness as well as right sided chest wall tenderness.Studies without evidence of acute emergent process.  Pelvic ultrasound as well as chest x-ray reassuring.  Suspect the patient's right-sided chest pain likely secondary to chest wall pain.  Wells PE 0 and PERC negative; low suspicion for PE.  Chest x-ray without obvious pneumonia, pneumothorax or other acute cardiopulmonary abnormality.  Noted improvement after Tylenol .  Regarding lower abdominal pain, patient initially reported symptoms similar to prior ectopic pregnancy with cramping type pelvic pain.  Pelvic ultrasound reassuring with evidence of fibroid.  Improved pain with Tylenol .  Offered additional imaging via CT scan but this was deferred.   Will recommend follow-up with OB/GYN/primary care in the outpatient setting for reassessment.  Treatment plan discussed with the patient and she acknowledged understanding was agreeable to said plan.  Patient overall well-appearing, afebrile in no acute distress. Worrisome signs and symptoms were discussed with the patient, and the patient acknowledged understanding to return to the ED if noticed. Patient was stable upon discharge.          Final Clinical Impression(s) / ED Diagnoses Final diagnoses:  None    Rx / DC Orders ED Discharge Orders     None         Addison Butter, [place] 09/22/23 1520    Auston Blush, MD 09/28/23 1316

## 2023-09-22 NOTE — ED Notes (Signed)
 Dc instructions reviewed with patient. Patient voiced understanding. Dc with belongings.

## 2023-09-22 NOTE — ED Triage Notes (Signed)
 Has had ectopic preg in the past , and this is similar pain.

## 2023-09-28 ENCOUNTER — Other Ambulatory Visit (HOSPITAL_COMMUNITY)
Admission: RE | Admit: 2023-09-28 | Discharge: 2023-09-28 | Disposition: A | Discharge status: Home / Self Care | Source: Ambulatory Visit | Attending: Obstetrics and Gynecology | Admitting: Obstetrics and Gynecology

## 2023-09-28 ENCOUNTER — Other Ambulatory Visit: Payer: Self-pay

## 2023-09-28 ENCOUNTER — Ambulatory Visit: Admitting: Obstetrics and Gynecology

## 2023-09-28 VITALS — BP 120/87 | HR 84 | Wt 168.0 lb

## 2023-09-28 DIAGNOSIS — R102 Pelvic and perineal pain unspecified side: Secondary | ICD-10-CM

## 2023-09-28 DIAGNOSIS — M791 Myalgia, unspecified site: Secondary | ICD-10-CM | POA: Diagnosis not present

## 2023-09-28 DIAGNOSIS — Z3009 Encounter for other general counseling and advice on contraception: Secondary | ICD-10-CM | POA: Diagnosis not present

## 2023-09-28 DIAGNOSIS — G8929 Other chronic pain: Secondary | ICD-10-CM | POA: Insufficient documentation

## 2023-09-28 DIAGNOSIS — R350 Frequency of micturition: Secondary | ICD-10-CM

## 2023-09-28 DIAGNOSIS — Z720 Tobacco use: Secondary | ICD-10-CM

## 2023-09-28 LAB — POCT URINALYSIS DIP (DEVICE)
Bilirubin Urine: NEGATIVE
Glucose, UA: NEGATIVE mg/dL
Ketones, ur: NEGATIVE mg/dL
Leukocytes,Ua: NEGATIVE
Nitrite: NEGATIVE
Protein, ur: NEGATIVE mg/dL
Specific Gravity, Urine: 1.02 (ref 1.005–1.030)
Urobilinogen, UA: 0.2 mg/dL (ref 0.0–1.0)
pH: 5.5 (ref 5.0–8.0)

## 2023-09-28 MED ORDER — BACLOFEN 10 MG PO TABS: 10.0000 mg | ORAL_TABLET | Freq: Two times a day (BID) | ORAL | 0 refills | Status: DC | PRN

## 2023-09-28 MED ORDER — NORETHINDRONE 0.35 MG PO TABS: 1.0000 | ORAL_TABLET | Freq: Every day | ORAL | 11 refills | Status: AC

## 2023-09-28 NOTE — Progress Notes (Signed)
 GYNECOLOGY VISIT  Patient name: Jill Burke MRN [id number]  Date of birth: September 01, 1985 Chief Complaint:   US  Follow Up  History:  Jill Burke is a 38 y.o. [phone] being seen today for multiple pain .  Went to the ED due to having pain in her abdomen, right flank and lower back pain. Feel that she has had increased urinary frequency. Given ibuprofen  and lidocaine  patch and it helped. Significant lower adominal pain. Does not know if the pain is worse when she has to go to the bathroom. No vaginal discharge or vaginal odor present. Feels there is a urine smell. Not doing a lot of exercise outside like she is sweating. Not in as much pain while taking the ibuprofen  daily. Shoulders and flank pain are constant as well as in the lower abdomen and lower back. Flank pain for about 5 days prior to hospital presentation and lower abdoinal pain 3 days prior to presentation. Also has lump in upper abdomen that was previously evaluated by dermatology and told that it is not a cyst. Has a skin ultrasound on Thursday and PCP appointment on Friday. Also states that she was told at ED visit that her hemoglobin is too high which has sparked additional concerns.   Overall she feels that she is having more symptoms, more frequently getting sick, has been having bone pain within the last few years, etc. She has been seen by ENT and returns on Thursday for re-evaluation as she had an active infection at initial evaluation. Had 2 menses last month (beginning and end of month) but otherwise hasn't changed.   Tryign to quit cigarettes - she has cut down her daily use from >21 to <10 daily.   Was trying to conceive but wants to hold off until she figures out her health issues   Paternal : HTN, DM, his sister with ovarian cancer, heart attack, stroke Maternal: HTN, DM, fibroyalgia, and unknown cancer (breast possibly), heart attack, stroke     Past Medical History:  Diagnosis Date   Anxiety    Chlamydia 2007    treated   Diabetes mellitus without complication (HCC)    Hypercholesteremia    Lump, breast    Panic attack     Past Surgical History:  Procedure Laterality Date   ECTOPIC PREGNANCY SHOTS      The following portions of the patient's history were reviewed and updated as appropriate: allergies, current medications, past family history, past medical history, past social history, past surgical history and problem list.   Health Maintenance:   Last pap     Component Value Date/Time   DIAGPAP  01/02/2021 1425    - Negative for intraepithelial lesion or malignancy (NILM)   HPVHIGH Negative 01/02/2021 1425   ADEQPAP  01/02/2021 1425    Satisfactory for evaluation; transformation zone component PRESENT.    High Risk HPV: Positive  Adequacy:  Satisfactory for evaluation, transformation zone component PRESENT  Diagnosis:  Atypical squamous cells of undetermined significance (ASC-US )  Last mammogram: 09/02/23 BIRADS 2 -    Review of Systems:  Pertinent items are noted in HPI. Comprehensive review of systems was otherwise negative.   Objective:  Physical Exam BP 120/87   Pulse 84   Wt 168 lb (76.2 kg)   LMP 08/10/2023 Comment: pt shielded ksm  BMI 30.24 kg/m    Physical Exam Vitals and nursing note reviewed.  Constitutional:      Appearance: Normal appearance.  HENT:  Head: Normocephalic and atraumatic.  Pulmonary:     Effort: Pulmonary effort is normal.  Abdominal:     Comments: Mild tenderness infraumbilical area and suprapubic region Equivocal carnett test  Skin:    General: Skin is warm and dry.  Neurological:     General: No focal deficit present.     Mental Status: She is alert.  Psychiatric:        Mood and Affect: Mood normal.        Behavior: Behavior normal.        Thought Content: Thought content normal.        Judgment: Judgment normal.     Labs and Imaging FINDINGS: Uterus   Measurements: 8.5 x 4.4 x 5.3 cm = volume: 103 mL. 1.2 x 1.1 x  1.1 cm slightly echogenic anterior subserosal fibroid, similar to prior study.   Endometrium   Thickness: Normal thickness, 15 mm. No focal abnormality visualized.   Right ovary   Measurements: 2.3 x 2.2 x 3.1 cm = volume: 12 mL. Normal appearance/no adnexal mass.   Left ovary   Measurements: 2.5 x 1.7 x 1.3 cm = volume: 3 mL. Normal appearance/no adnexal mass.   Pulsed Doppler evaluation of both ovaries demonstrates normal low-resistance arterial and venous waveforms.   Other findings   No abnormal free fluid.   IMPRESSION: Small anterior subserosal uterine fibroid.   No acute findings.     Electronically Signed   By: Janeece Mechanic M.D.   On: 09/22/2023 13:32     Assessment & Plan:   1. Myalgia (Primary) Trial of muscle relaxer for pain. Suspect MSK etiology. Noted that fibroid is <2cm and should not be causing flank pain. Recommend that at her upcoming PCP appointment can review if there is any change in symptoms with taking the muscle relaxer.  Reviewed that description and distribution of pain are more concerning for non-gynecologic etiology.  - baclofen (LIORESAL) 10 MG tablet; Take 1 tablet (10 mg total) by mouth 2 (two) times daily as needed for muscle spasms.  Dispense: 30 each; Refill: 0  2. Chronic pelvic pain in female Unable to get labs today due to late start of visit, ordered for future collections. In light of slight change in menses and age, TSH, FSH, and testosterone ordered. Vaginitis swab collected as well.   - Follicle stimulating hormone; Future - Testosterone,Free and Total; Future - TSH Rfx on Abnormal to Free T4; Future - Cervicovaginal ancillary only  3. Encounter for counseling regarding contraception Does not want to conceive at this time and would like to resume POP. CHC contraindicated in setting of current tobacco.  - norethindrone  (MICRONOR ) 0.35 MG tablet; Take 1 tablet (0.35 mg total) by mouth daily.  Dispense: 28 tablet; Refill:  11  4. Urinary frequency Urine dip due to increased urinary urge and frequency, will follow up results.    5. Tobacco use Congratulated on progress made with tobacco cessation efforts.     Routine preventative health maintenance measures emphasized.  Kiki Pelton, MD [facility], [facility]

## 2023-09-29 ENCOUNTER — Encounter (HOSPITAL_BASED_OUTPATIENT_CLINIC_OR_DEPARTMENT_OTHER): Payer: Self-pay

## 2023-09-29 ENCOUNTER — Emergency Department (HOSPITAL_BASED_OUTPATIENT_CLINIC_OR_DEPARTMENT_OTHER)

## 2023-09-29 ENCOUNTER — Encounter: Payer: Self-pay | Admitting: Obstetrics and Gynecology

## 2023-09-29 ENCOUNTER — Other Ambulatory Visit: Payer: Self-pay

## 2023-09-29 ENCOUNTER — Emergency Department (HOSPITAL_BASED_OUTPATIENT_CLINIC_OR_DEPARTMENT_OTHER)
Admission: EM | Admit: 2023-09-29 | Discharge: 2023-09-29 | Disposition: A | Discharge status: Home / Self Care | Attending: Emergency Medicine | Admitting: Emergency Medicine

## 2023-09-29 ENCOUNTER — Telehealth: Payer: Self-pay

## 2023-09-29 DIAGNOSIS — R1084 Generalized abdominal pain: Secondary | ICD-10-CM | POA: Diagnosis not present

## 2023-09-29 DIAGNOSIS — M545 Low back pain, unspecified: Secondary | ICD-10-CM | POA: Diagnosis not present

## 2023-09-29 DIAGNOSIS — R109 Unspecified abdominal pain: Secondary | ICD-10-CM | POA: Diagnosis present

## 2023-09-29 DIAGNOSIS — Z91040 Latex allergy status: Secondary | ICD-10-CM | POA: Insufficient documentation

## 2023-09-29 HISTORY — DX: Leiomyoma of uterus, unspecified: D25.9

## 2023-09-29 LAB — COMPREHENSIVE METABOLIC PANEL WITH GFR
ALT: 9 U/L (ref 0–44)
AST: 15 U/L (ref 15–41)
Albumin: 4.4 g/dL (ref 3.5–5.0)
Alkaline Phosphatase: 59 U/L (ref 38–126)
Anion gap: 12 (ref 5–15)
BUN: 14 mg/dL (ref 6–20)
CO2: 23 mmol/L (ref 22–32)
Calcium: 9.6 mg/dL (ref 8.9–10.3)
Chloride: 105 mmol/L (ref 98–111)
Creatinine, Ser: 0.86 mg/dL (ref 0.44–1.00)
GFR, Estimated: >60 mL/min (ref >60)
Glucose, Bld: 95 mg/dL (ref 70–99)
Potassium: 4 mmol/L (ref 3.5–5.1)
Sodium: 140 mmol/L (ref 135–145)
Total Bilirubin: <0.2 mg/dL (ref 0.0–1.2)
Total Protein: 6.6 g/dL (ref 6.5–8.1)

## 2023-09-29 LAB — URINALYSIS, ROUTINE W REFLEX MICROSCOPIC
Bacteria, UA: NONE SEEN
Bilirubin Urine: NEGATIVE
Glucose, UA: NEGATIVE mg/dL
Hgb urine dipstick: NEGATIVE
Ketones, ur: NEGATIVE mg/dL
Leukocytes,Ua: NEGATIVE
Nitrite: NEGATIVE
Protein, ur: NEGATIVE mg/dL
Specific Gravity, Urine: 1.009 (ref 1.005–1.030)
pH: 6 (ref 5.0–8.0)

## 2023-09-29 LAB — CBC
HCT: 43.2 % (ref 36.0–46.0)
Hemoglobin: 14.9 g/dL (ref 12.0–15.0)
MCH: 31.8 pg (ref 26.0–34.0)
MCHC: 34.5 g/dL (ref 30.0–36.0)
MCV: 92.3 fL (ref 80.0–100.0)
Platelets: 339 10*3/uL (ref 150–400)
RBC: 4.68 MIL/uL (ref 3.87–5.11)
RDW: 11.9 % (ref 11.5–15.5)
WBC: 9.7 10*3/uL (ref 4.0–10.5)
nRBC: 0 % (ref 0.0–0.2)

## 2023-09-29 LAB — LIPASE, BLOOD: Lipase: 35 U/L (ref 11–51)

## 2023-09-29 LAB — PREGNANCY, URINE: Preg Test, Ur: NEGATIVE

## 2023-09-29 NOTE — Telephone Encounter (Addendum)
 Pt call transferred from the front desk and pt begins to explain that she is having a lot of pain.  She stated that she has had an appt with her other doctor and they are saying that she needed to contact us .  Pt reports that her pain is lower back.  I explained to the pt that the pain that she is feeling does not seem to be related to GYN issues.  I encouraged pt to please reach out to her PCP for evaluation.   Suzanne Kho,RN

## 2023-09-29 NOTE — Discharge Instructions (Addendum)
 1.  Take Colace twice daily as a stool softener.  Take MiraLAX  daily until you have had a good bowel movement.  Then take if you have not had a bowel movement within 2 to 3 days.  See if this improves her symptoms. 2.  Reviewed information about constipation or discharge instructions.  Try to evaluate your diet and follow dietary recommendations. 3.  Continue to work with your family doctor for recurrent abdominal and back pain.

## 2023-09-29 NOTE — ED Provider Notes (Signed)
 [place] EMERGENCY DEPARTMENT AT [facility] Provider Note   CSN: [id number] Arrival date & time: 09/29/23  1750     History  No chief complaint on file.   Jill Burke is a 38 y.o. female.  HPI Patient has had ongoing problems with pain actually for several years.  She reports she has had variable diagnoses including fibroids but then her GYN told her that her pain is not due to that.  She reports for the past 2 weeks she has been having pain that she indicates is on both sides of her flanks at times.  Other times it is her lower abdomen.  She also has some migratory pain in the upper abdomen.  She has been trying lidocaine  patches and ibuprofen  without relief.  She reports sometimes having urinary frequency and yesterday saw blood in her urine.  She was seen by GYN yesterday and reports that her urinalysis was tested and negative.  She was given muscle relaxers to try for pain.    Home Medications Prior to Admission medications   Medication Sig Start Date End Date Taking? Authorizing Provider  baclofen (LIORESAL) 10 MG tablet Take 1 tablet (10 mg total) by mouth 2 (two) times daily as needed for muscle spasms. 09/28/23   Ajewole, Christana, MD  ibuprofen  (ADVIL ) 600 MG tablet Take 1 tablet (600 mg total) by mouth every 6 (six) hours as needed. 09/22/23   [place] Butter, PA  lidocaine  (LIDODERM ) 5 % Place 1 patch onto the skin daily. Remove & Discard patch within 12 hours or as directed by MD 09/22/23   [place] Butter, PA  norethindrone  (MICRONOR ) 0.35 MG tablet Take 1 tablet (0.35 mg total) by mouth daily. 09/28/23   Ajewole, Christana, MD  nystatin  cream (MYCOSTATIN ) Apply thin layer after each breastfeeding or pumping session, Continue to use for 1-2 weeks after all symptoms have resolved. 08/11/23   Zelma Hidden, FNP  Prenatal Vit-Fe Fumarate-FA (PREPLUS) 27-1 MG TABS Take 1 tablet by mouth daily. 08/11/23   Zelma Hidden, FNP  triamcinolone  cream (KENALOG ) 0.1 %  Apply 1 Application topically 2 (two) times daily. 08/11/23   Zelma Hidden, FNP  valACYclovir  (VALTREX ) 500 MG tablet Take 1 tablet (500 mg total) by mouth 2 (two) times daily. Take twice a day for 5 day 08/11/23   Zelma Hidden, FNP      Allergies    Latex and Soap    Review of Systems   Review of Systems  Physical Exam Updated Vital Signs BP 112/69 (BP Location: Right Arm)   Pulse 86   Temp 98.8 F (37.1 C) (Oral)   Resp 18   Ht 5\' 2"  (1.575 m)   Wt 75.8 kg   LMP 09/10/2023 Comment: pt shielded ksm  SpO2 98%   BMI 30.54 kg/m  Physical Exam Constitutional:      Comments: Well-nourished well-developed.  Alert nontoxic clinically well in appearance.  HENT:     Mouth/Throat:     Mouth: Mucous membranes are moist.     Pharynx: Oropharynx is clear.  Eyes:     Extraocular Movements: Extraocular movements intact.  Cardiovascular:     Rate and Rhythm: Normal rate and regular rhythm.  Pulmonary:     Effort: Pulmonary effort is normal.     Breath sounds: Normal breath sounds.  Abdominal:     Comments: Abdomen soft without guarding or mass.  Patient endorses some tenderness diffusely more to the right lower quadrant.  Musculoskeletal:        General: No swelling or tenderness. Normal range of motion.     Right lower leg: No edema.     Left lower leg: No edema.  Skin:    General: Skin is warm and dry.  Neurological:     General: No focal deficit present.     Mental Status: She is oriented to person, place, and time.     Motor: No weakness.     Coordination: Coordination normal.  Psychiatric:        Mood and Affect: Mood normal.     ED Results / Procedures / Treatments   Labs (all labs ordered are listed, but only abnormal results are displayed) Labs Reviewed  URINALYSIS, ROUTINE W REFLEX MICROSCOPIC - Abnormal; Notable for the following components:      Result Value   Color, Urine COLORLESS (*)    All other components within normal limits  LIPASE, BLOOD   COMPREHENSIVE METABOLIC PANEL WITH GFR  CBC  PREGNANCY, URINE    EKG EKG Interpretation Date/Time:  Wednesday Sep 29 2023 18:16:37 EDT Ventricular Rate:  83 PR Interval:  148 QRS Duration:  76 QT Interval:  362 QTC Calculation: 425 R Axis:   65  Text Interpretation: Normal sinus rhythm Normal ECG No previous ECGs available agree, no old comparison Confirmed by Wynetta Heckle [id number]) on 09/29/2023 8:37:30 PM  Radiology CT ABDOMEN PELVIS WO CONTRAST Result Date: 09/29/2023 CLINICAL DATA:  Acute abdominal pain.  Low back pain. EXAM: CT ABDOMEN AND PELVIS WITHOUT CONTRAST TECHNIQUE: Multidetector CT imaging of the abdomen and pelvis was performed following the standard protocol without IV contrast. RADIATION DOSE REDUCTION: This exam was performed according to the departmental dose-optimization program which includes automated exposure control, adjustment of the mA and/or kV according to patient size and/or use of iterative reconstruction technique. COMPARISON:  Pelvic ultrasound 09/22/2023 FINDINGS: Lower chest: No acute abnormality. Hepatobiliary: No focal liver abnormality is seen. No gallstones, gallbladder wall thickening, or biliary dilatation. Pancreas: Unremarkable. No pancreatic ductal dilatation or surrounding inflammatory changes. Spleen: Normal in size without focal abnormality. Adrenals/Urinary Tract: Adrenal glands are unremarkable. Kidneys are normal, without renal calculi, focal lesion, or hydronephrosis. Bladder is unremarkable. Stomach/Bowel: Stomach is within normal limits. Appendix appears normal. No evidence of bowel wall thickening, distention, or inflammatory changes. Vascular/Lymphatic: No significant vascular findings are present. No enlarged abdominal or pelvic lymph nodes. Reproductive: Uterus and bilateral adnexa are unremarkable. Other: There is a small fat containing umbilical hernia cyst. No abdominopelvic ascites. Musculoskeletal: No fracture is seen. IMPRESSION: 1.  No acute localizing process in the abdomen or pelvis. 2. Small fat containing umbilical hernia. Electronically Signed   By: Tyron Gallon M.D.   On: 09/29/2023 21:21    Procedures Procedures    Medications Ordered in ED Medications - No data to display  ED Course/ Medical Decision Making/ A&P                                 Medical Decision Making Amount and/or Complexity of Data Reviewed Labs: ordered. Radiology: ordered.   Presents as outlined.  I reviewed EMR.  Patient has had evaluation for abdominal pain including ultrasounds.  She has had a number of consults.  At this time pain is variable in his location and timing.  Currently she is reporting pain to the right lateral lower abdomen.  Review of EMR does not show any prior CT  scanning.  Will proceed with CT scan.  Comprehensive metabolic panel normal with normal LFTs.  Lipase 35.  Urinalysis normal.  Pregnancy negative.  CBC normal.  CT scan interpreted by radiology as for no acute findings with small fat-containing umbilical hernia.  I have personally reviewed this study and observed significant amount of stool throughout the colon.  Not commented by radiology but appears consistent with constipation.  I have discussed constipation management with the patient.  Given the variability of her pains that she gets that migrate from her back up to under her ribs to her lower abdomen I feel that constipation may be an issue.  She will start a regimen to see if this is helpful.  She will continue to work with outpatient providers for persistent fairly longstanding abdominal pain.        Final Clinical Impression(s) / ED Diagnoses Final diagnoses:  Generalized abdominal pain  Low back pain without sciatica, unspecified back pain laterality, unspecified chronicity    Rx / DC Orders ED Discharge Orders     None         Wynetta Heckle, MD 10/01/23 1850

## 2023-09-29 NOTE — ED Triage Notes (Signed)
 Pt presents with ongoing low back and low abd pain. She has had the same pains intermittently since 2023. This episode of pain has been constant for 2 weeks. She was seen in the ED 1 week ago and had ectopic ruled out and was started on lidocaine  patches and ibuprofen . She does have some mild temporary relief from the lidocaine . She has been having some urinary frequency and blood in her urine. She saw her OB/GYN yesterday and reports she had a negative UA for urine and was prescribed a muscle relaxer that she has not started yet.   Pt also reports pain to bilateral anterior lower extremities without swelling. She reports ShOB and heart palpitations since all the pain started as well.

## 2023-09-30 ENCOUNTER — Other Ambulatory Visit

## 2023-09-30 ENCOUNTER — Ambulatory Visit: Payer: Self-pay | Admitting: Obstetrics and Gynecology

## 2023-09-30 DIAGNOSIS — J309 Allergic rhinitis, unspecified: Secondary | ICD-10-CM

## 2023-09-30 DIAGNOSIS — G8929 Other chronic pain: Secondary | ICD-10-CM

## 2023-09-30 HISTORY — DX: Allergic rhinitis, unspecified: J30.9

## 2023-10-01 LAB — TSH RFX ON ABNORMAL TO FREE T4: TSH: 1.29 u[IU]/mL (ref 0.450–4.500)

## 2023-10-01 LAB — TESTOSTERONE,FREE AND TOTAL
Testosterone, Free: 1.9 pg/mL (ref 0.0–4.2)
Testosterone: 21 ng/dL (ref 8–60)

## 2023-10-01 LAB — FOLLICLE STIMULATING HORMONE: FSH: 2.6 m[IU]/mL

## 2023-10-05 ENCOUNTER — Telehealth: Payer: Self-pay | Admitting: Lactation Services

## 2023-10-05 LAB — CERVICOVAGINAL ANCILLARY ONLY
Chlamydia: NEGATIVE
Comment: NEGATIVE
Comment: NEGATIVE
Comment: NEGATIVE
Comment: NEGATIVE
Comment: NEGATIVE
Comment: NEGATIVE
Comment: NEGATIVE
Comment: NEGATIVE
Comment: NORMAL
Neisseria Gonorrhea: NEGATIVE
Trichomonas: NEGATIVE

## 2023-10-05 NOTE — Telephone Encounter (Addendum)
 After conversing with Dr. Ajewole, it is recommended to add GC, Chlamydia, Trich and Mycoplasma to Vaginal Swab from 5/20. Called and spoke with [facility] in Cytology, she reports she can add Gonorrhea, Chlamydia and Trich, they do not do Mycoplasma. Will notify patient in regards to adding more labs on via Mychart.   Add on Testing form faxed to Cytology, fax confirmation received.

## 2023-10-14 ENCOUNTER — Encounter: Payer: Self-pay | Admitting: General Practice

## 2023-10-26 ENCOUNTER — Encounter: Admitting: Physical Therapy

## 2023-11-09 ENCOUNTER — Encounter: Admitting: Physical Therapy

## 2023-11-11 ENCOUNTER — Ambulatory Visit: Admitting: Obstetrics and Gynecology

## 2023-11-22 NOTE — Therapy (Deleted)
 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  Rationale for Evaluation and Treatment: Rehabilitation  ONSET DATE: ***  SUBJECTIVE:                                                                                                                                                                                           SUBJECTIVE STATEMENT: *** Fluid intake:   PAIN:  Are you having pain? {yes/no:20286} NPRS scale: ***/10 Pain location: {pelvic pain location:27098}  Pain type: {type:313116} Pain description: {PAIN DESCRIPTION:21022940}   Aggravating factors: *** Relieving factors: ***  PRECAUTIONS: {Therapy precautions:24002}  RED FLAGS: {PT Red Flags:29287}   WEIGHT BEARING RESTRICTIONS: No  FALLS:  Has patient fallen in last 6 months? {fallsyesno:27318}  OCCUPATION: ***  ACTIVITY LEVEL : ***  PLOF: {PLOF:24004}  PATIENT GOALS: ***  PERTINENT HISTORY:   Diabetes; Uterine Fibroid Sexual abuse: {Yes/No:304960894}  BOWEL MOVEMENT: Pain with bowel movement: {yes/no:20286} Type of bowel movement:{PT BM type:27100} Fully empty rectum: {No/Yes:304960894} Leakage: {Yes/No:304960894} Pads: {Yes/No:304960894} Fiber supplement/laxative {YES/NO AS:20300}  URINATION: Pain with urination: {yes/no:20286} Fully empty bladder: {Yes/No:[id number]}*** Stream: {PT urination:27102} Urgency: {YES/NO AS:20300} Frequency: *** Leakage: {PT leakage:27103} Pads: {Yes/No:304960894}  INTERCOURSE:  Ability to have vaginal penetration {YES/NO:21197} Pain with intercourse: {pain with intercourse PA:27099} Dryness{YES/NO AS:20300} Climax: *** Marinoff Scale: ***/3 Laxative:  PREGNANCY: Vaginal deliveries *** Tearing {Yes***/No:304960894} Episiotomy {YES/NO AS:20300} C-section deliveries *** Currently pregnant {Yes***/No:[id number]}  PROLAPSE: {PT prolapse:27101}   OBJECTIVE:  Note: Objective measures were completed at Evaluation unless otherwise noted.  DIAGNOSTIC FINDINGS:  ***  PATIENT  SURVEYS:  {rehab surveys:24030}  PFIQ-7: ***  COGNITION: Overall cognitive status: {cognition:24006}     SENSATION: Light touch: {intact/deficits:24005}  LUMBAR SPECIAL TESTS:  {lumbar special test:25242}  FUNCTIONAL TESTS:  {Functional tests:24029}  GAIT: Assistive device utilized: {Assistive devices:23999} Comments: ***  POSTURE: {posture:25561}   LUMBARAROM/PROM:  A/PROM A/PROM  eval  Flexion   Extension   Right lateral flexion   Left lateral flexion   Right rotation   Left rotation    (Blank rows = not tested)  LOWER EXTREMITY ROM:  {AROM/PROM:27142} ROM Right eval Left eval  Hip flexion    Hip extension    Hip abduction    Hip adduction    Hip internal rotation    Hip external rotation    Knee flexion    Knee extension    Ankle dorsiflexion    Ankle plantarflexion    Ankle inversion    Ankle eversion     (Blank  rows = not tested)  LOWER EXTREMITY MMT:  MMT Right eval Left eval  Hip flexion    Hip extension    Hip abduction    Hip adduction    Hip internal rotation    Hip external rotation    Knee flexion    Knee extension    Ankle dorsiflexion    Ankle plantarflexion    Ankle inversion    Ankle eversion     (Blank rows = not tested) PALPATION:   General: ***  Pelvic Alignment: ***  Abdominal: ***                External Perineal Exam: ***                             Internal Pelvic Floor: ***  Patient confirms identification and approves PT to assess internal pelvic floor and treatment {yes/no:20286}  PELVIC MMT:   MMT eval  Vaginal   Internal Anal Sphincter   External Anal Sphincter   Puborectalis   Diastasis Recti   (Blank rows = not tested)        TONE: ***  PROLAPSE: ***  TODAY'S TREATMENT:                                                                                                                              DATE: ***  EVAL ***   PATIENT EDUCATION:  Education details: *** Person educated: {Person educated:25204} Education method: {Education Method:25205} Education comprehension: {Education Comprehension:25206}  HOME EXERCISE PROGRAM: ***  ASSESSMENT:  CLINICAL IMPRESSION: Patient is a *** y.o. *** who was seen today for physical therapy evaluation and treatment for ***.   OBJECTIVE IMPAIRMENTS: {opptimpairments:25111}.   ACTIVITY LIMITATIONS: {activitylimitations:27494}  PARTICIPATION LIMITATIONS: {participationrestrictions:25113}  PERSONAL FACTORS: {Personal factors:25162} are also affecting patient's functional outcome.   REHAB POTENTIAL: {rehabpotential:25112}  CLINICAL DECISION MAKING: {clinical decision making:25114}  EVALUATION COMPLEXITY: {Evaluation complexity:25115}   GOALS: Goals reviewed with patient? {yes/no:20286}  SHORT TERM GOALS: Target date: ***  *** Baseline: Goal status: INITIAL  2.  *** Baseline:   Goal status: INITIAL  3.  *** Baseline:  Goal status: INITIAL  4.  *** Baseline:  Goal status: INITIAL  5.  *** Baseline:  Goal status: INITIAL  6.  *** Baseline:  Goal status: INITIAL  LONG TERM GOALS: Target date: ***  *** Baseline:  Goal status: INITIAL  2.  *** Baseline:  Goal status: INITIAL  3.  *** Baseline:  Goal status: INITIAL  4.  *** Baseline:  Goal status: INITIAL  5.  *** Baseline:  Goal status: INITIAL  6.  *** Baseline:  Goal status: INITIAL  PLAN:  PT FREQUENCY: {rehab frequency:25116}  PT DURATION: {rehab duration:25117}  PLANNED INTERVENTIONS: {rehab planned interventions:25118::97110-Therapeutic exercises,97530- Therapeutic [id number]- Neuromuscular re-education,97535- Self Rjmz,02859- Manual therapy}  PLAN FOR NEXT SESSION: ***   Konya Fauble, PT 11/22/2023, 12:31 PM

## 2023-11-23 ENCOUNTER — Encounter: Admitting: Physical Therapy

## 2023-11-29 ENCOUNTER — Ambulatory Visit: Admitting: Neurology

## 2023-11-29 ENCOUNTER — Encounter: Payer: Self-pay | Admitting: Neurology

## 2023-11-29 NOTE — Therapy (Deleted)
 OUTPATIENT PHYSICAL THERAPY FEMALE PELVIC EVALUATION   Patient Name: Jill Burke MRN: [id number] DOB:[birth year], 38 y.o., female Today's Date: 11/29/2023  END OF SESSION:   Past Medical History:  Diagnosis Date   Anxiety    Chlamydia 2007   treated   Diabetes mellitus without complication (HCC)    Hypercholesteremia    Lump, breast    Panic attack    Uterine fibroid    Past Surgical History:  Procedure Laterality Date   ECTOPIC PREGNANCY SHOTS     Patient Active Problem List   Diagnosis Date Noted   Allergic rhinitis 09/30/2023   Recurrent sinusitis 05/21/2023   History of ectopic pregnancy 11/11/2022   Atypical squamous cells of undetermined significance (ASCUS) on Papanicolaou smear of cervix 06/30/2021   Refractory migraine without aura 06/30/2021   Family history of early CAD 05/30/2021   Mixed hyperlipidemia 05/30/2021   Palpitations 05/15/2021   Panic attacks 09/12/2019   Anxiety in acute stress reaction 09/12/2019   Abdominal pain 09/25/2014   Left breast lump 06/21/2012   Breast pain, right 06/21/2012    PCP: Rosalea Rosina SAILOR, PA  REFERRING PROVIDER: Delores Nidia CROME, FNP   REFERRING DIAG: N94.10 (ICD-10-CM) - Dyspareunia in female   THERAPY DIAG:  No diagnosis found.  Rationale for Evaluation and Treatment: Rehabilitation  ONSET DATE: ***  SUBJECTIVE:                                                                                                                                                                                           SUBJECTIVE STATEMENT: *** Fluid intake:   PAIN:  Are you having pain? {yes/no:20286} NPRS scale: ***/10 Pain location: {pelvic pain location:27098}  Pain type: {type:313116} Pain description: {PAIN DESCRIPTION:21022940}   Aggravating factors: *** Relieving factors: ***  PRECAUTIONS: None  RED FLAGS: {PT Red Flags:29287}   WEIGHT BEARING RESTRICTIONS: {Yes ***/No:24003}  FALLS:  Has patient  fallen in last 6 months? {fallsyesno:27318}  OCCUPATION: ***  ACTIVITY LEVEL : ***  PLOF: {PLOF:24004}  PATIENT GOALS: ***  PERTINENT HISTORY:  Diabetes Sexual abuse: {Yes/No:304960894}  BOWEL MOVEMENT: Pain with bowel movement: {yes/no:20286} Type of bowel movement:{PT BM type:27100} Fully empty rectum: {No/Yes:304960894} Leakage: {Yes/No:304960894} Pads: {Yes/No:304960894} Fiber supplement/laxative {YES/NO AS:20300}  URINATION: Pain with urination: {yes/no:20286} Fully empty bladder: {Yes/No:304960894}*** Stream: {PT urination:27102} Urgency: {YES/NO AS:20300} Frequency: *** Leakage: {PT leakage:27103} Pads: {Yes/No:304960894}  INTERCOURSE:  Ability to have vaginal penetration {YES/NO:21197} Pain with intercourse: {pain with intercourse PA:27099} Dryness{YES/NO AS:20300} Climax: *** Marinoff Scale: ***/3 Laxative:  PREGNANCY: Vaginal deliveries *** Tearing {Yes***/No:304960894} Episiotomy {YES/NO AS:20300} C-section deliveries *** Currently pregnant {Yes***/No:304960894}  PROLAPSE: {PT prolapse:27101}  OBJECTIVE:  Note: Objective measures were completed at Evaluation unless otherwise noted.  DIAGNOSTIC FINDINGS:  ***  PATIENT SURVEYS:  {rehab surveys:24030}  PFIQ-7: ***  COGNITION: Overall cognitive status: {cognition:24006}     SENSATION: Light touch: {intact/deficits:24005}  LUMBAR SPECIAL TESTS:  {lumbar special test:25242}  FUNCTIONAL TESTS:  {Functional tests:24029}  GAIT: Assistive device utilized: {Assistive devices:23999} Comments: ***  POSTURE: {posture:25561}   LUMBARAROM/PROM:  A/PROM A/PROM  eval  Flexion   Extension   Right lateral flexion   Left lateral flexion   Right rotation   Left rotation    (Blank rows = not tested)  LOWER EXTREMITY ROM:  {AROM/PROM:27142} ROM Right eval Left eval  Hip flexion    Hip extension    Hip abduction    Hip adduction    Hip internal rotation    Hip external rotation     Knee flexion    Knee extension    Ankle dorsiflexion    Ankle plantarflexion    Ankle inversion    Ankle eversion     (Blank rows = not tested)  LOWER EXTREMITY MMT:  MMT Right eval Left eval  Hip flexion    Hip extension    Hip abduction    Hip adduction    Hip internal rotation    Hip external rotation    Knee flexion    Knee extension    Ankle dorsiflexion    Ankle plantarflexion    Ankle inversion    Ankle eversion     (Blank rows = not tested) PALPATION:   General: ***  Pelvic Alignment: ***  Abdominal: ***                External Perineal Exam: ***                             Internal Pelvic Floor: ***  Patient confirms identification and approves PT to assess internal pelvic floor and treatment {yes/no:20286}  PELVIC MMT:   MMT eval  Vaginal   Internal Anal Sphincter   External Anal Sphincter   Puborectalis   Diastasis Recti   (Blank rows = not tested)        TONE: ***  PROLAPSE: ***  TODAY'S TREATMENT:                                                                                                                              DATE: ***  EVAL ***   PATIENT EDUCATION:  Education details: *** Person educated: {Person educated:25204} Education method: {Education Method:25205} Education comprehension: {Education Comprehension:25206}  HOME EXERCISE PROGRAM: ***  ASSESSMENT:  CLINICAL IMPRESSION: Patient is a *** y.o. *** who was seen today for physical therapy evaluation and treatment for ***.   OBJECTIVE IMPAIRMENTS: {opptimpairments:25111}.   ACTIVITY LIMITATIONS: {activitylimitations:27494}  PARTICIPATION LIMITATIONS: {participationrestrictions:25113}  PERSONAL FACTORS: {Personal factors:25162} are also affecting patient's functional outcome.   REHAB POTENTIAL: {rehabpotential:25112}  CLINICAL DECISION MAKING: {clinical decision making:25114}  EVALUATION COMPLEXITY: {Evaluation complexity:25115}   GOALS: Goals reviewed  with patient? {yes/no:20286}  SHORT TERM GOALS: Target date: ***  *** Baseline: Goal status: INITIAL  2.  *** Baseline:  Goal status: INITIAL  3.  *** Baseline:  Goal status: INITIAL  4.  *** Baseline:  Goal status: INITIAL  5.  *** Baseline:  Goal status: INITIAL  6.  *** Baseline:  Goal status: INITIAL  LONG TERM GOALS: Target date: ***  *** Baseline:  Goal status: INITIAL  2.  *** Baseline:  Goal status: INITIAL  3.  *** Baseline:  Goal status: INITIAL  4.  *** Baseline:  Goal status: INITIAL  5.  *** Baseline:  Goal status: INITIAL  6.  *** Baseline:  Goal status: INITIAL  PLAN:  PT FREQUENCY: {rehab frequency:25116}  PT DURATION: {rehab duration:25117}  PLANNED INTERVENTIONS: {rehab planned interventions:25118::97110-Therapeutic exercises,97530- Therapeutic [phone]- Neuromuscular re-education,97535- Self Rjmz,02859- Manual therapy}  PLAN FOR NEXT SESSION: ***   Kemaria Dedic, PT 11/29/2023, 9:47 AM

## 2023-11-30 ENCOUNTER — Encounter: Admitting: Physical Therapy

## 2023-12-20 ENCOUNTER — Ambulatory Visit: Admitting: Obstetrics and Gynecology

## 2023-12-27 ENCOUNTER — Other Ambulatory Visit: Payer: Self-pay

## 2023-12-27 DIAGNOSIS — R0789 Other chest pain: Secondary | ICD-10-CM | POA: Insufficient documentation

## 2023-12-27 DIAGNOSIS — D259 Leiomyoma of uterus, unspecified: Secondary | ICD-10-CM | POA: Insufficient documentation

## 2023-12-27 DIAGNOSIS — N63 Unspecified lump in unspecified breast: Secondary | ICD-10-CM | POA: Insufficient documentation

## 2023-12-27 DIAGNOSIS — E119 Type 2 diabetes mellitus without complications: Secondary | ICD-10-CM | POA: Insufficient documentation

## 2023-12-27 DIAGNOSIS — F419 Anxiety disorder, unspecified: Secondary | ICD-10-CM | POA: Insufficient documentation

## 2023-12-27 DIAGNOSIS — E7800 Pure hypercholesterolemia, unspecified: Secondary | ICD-10-CM | POA: Insufficient documentation

## 2023-12-28 ENCOUNTER — Ambulatory Visit

## 2024-01-12 ENCOUNTER — Telehealth: Payer: Self-pay | Admitting: Family Medicine

## 2024-01-12 ENCOUNTER — Ambulatory Visit: Admitting: Cardiology

## 2024-01-12 NOTE — Telephone Encounter (Signed)
 Called today to cancel her follow up appointment with Dr. Jeralyn on 09/09 because she has not seen her urologist. Patient wants to come back in November or December to do her annual and a pap per her request.

## 2024-01-18 ENCOUNTER — Ambulatory Visit: Admitting: Obstetrics and Gynecology

## 2024-02-16 NOTE — Therapy (Unsigned)
 OUTPATIENT PHYSICAL THERAPY FEMALE PELVIC EVALUATION   Patient Name: Jill Burke MRN: [id number] DOB:12/24/1985, 38 y.o., female Today's Date: 02/17/2024  END OF SESSION:  PT End of Session - 02/17/24 1312     Visit Number 1    Date for Recertification  08/17/24    Authorization Type [employer]    PT Start Time 1400    PT Stop Time 1445    PT Time Calculation (min) 45 min    Activity Tolerance Patient tolerated treatment well    Behavior During Therapy [facility] for tasks assessed/performed          Past Medical History:  Diagnosis Date   Abdominal pain 09/25/2014   Seen in [facility], no significant findings. To f/u at office for US  and repeat pregnancy test.     Allergic rhinitis 09/30/2023   Anxiety    Anxiety in acute stress reaction 09/12/2019   Atypical chest pain    Atypical squamous cells of undetermined significance (ASCUS) on Papanicolaou smear of cervix 06/30/2021   Breast pain, right 06/21/2012   Patient referred to the [facility] for bilateral breast ultrasound. Appointment scheduled for Monday, June 27, 2012 at 1245.     Chlamydia 2007   treated   Diabetes mellitus without complication (HCC)    Family history of early CAD 05/30/2021   History of ectopic pregnancy 11/11/2022   Hypercholesteremia    Left breast lump 06/21/2012   Two left breast lumps at 3 o'clock and 12 o'clock. Patient referred to the [facility] for bilateral breast ultrasound. Appointment scheduled for Monday, June 27, 2012 at 1245.     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 06/30/2021   Uterine fibroid    Past Surgical History:  Procedure Laterality Date   ECTOPIC PREGNANCY SHOTS     Patient Active Problem List   Diagnosis Date Noted   Anxiety    Atypical chest pain    Diabetes mellitus without complication (HCC)     Hypercholesteremia    Lump, breast    Uterine fibroid    Allergic rhinitis 09/30/2023   Recurrent sinusitis 05/21/2023   History of ectopic pregnancy 11/11/2022   Atypical squamous cells of undetermined significance (ASCUS) on Papanicolaou smear of cervix 06/30/2021   Refractory migraine without aura 06/30/2021   Family history of early CAD 05/30/2021   Mixed hyperlipidemia 05/30/2021   Palpitations 05/15/2021   Panic attacks 09/12/2019   Anxiety in acute stress reaction 09/12/2019   Abdominal pain 09/25/2014   Left breast lump 06/21/2012   Breast pain, right 06/21/2012   Chlamydia 2007    PCP: Rosalea Rosina SAILOR, PA  REFERRING PROVIDER: Delores Nidia CROME, FNP   REFERRING DIAG: N94.10 (ICD-10-CM) - Dyspareunia in female   THERAPY DIAG:  Cramp and spasm  Lower abdominal pain  Other low back pain  Rationale for Evaluation and Treatment: Rehabilitation  ONSET DATE: 4/25  SUBJECTIVE:  SUBJECTIVE STATEMENT: Pain with intercourse is consistent with the last 6 months. Patient has had blood in her urine for the past 1.5 years. If she does not urinate when she feels she is will leak urine.  Fluid intake: water  FUNCTIONAL LIMITATIONS: none  PERTINENT HISTORY:  Medications for current condition: none Surgeries: none Other: Diabetes; uterine fibroid Sexual abuse: No  PAIN:  Are you having pain? Yes NPRS scale: 3-10/10 Pain location: lower right abdomen, right flank, and right gluteal  Pain type: cramp and stabbing in right lower quadrant; pain in right flank and gluteal is discomfort Pain description: intermittent , can last for hours and days  Aggravating factors: penile penetration, comes on randomly Relieving factors: tylenol , heating pad, leaves randomly  PRECAUTIONS: None  RED  FLAGS: None   WEIGHT BEARING RESTRICTIONS: Yes    FALLS:  Has patient fallen in last 6 months? No  OCCUPATION: accounting sitting at computer  ACTIVITY LEVEL : not exercise regularly   PLOF: Independent  PATIENT GOALS: reduce the amount she is using the restroom, reduce pain   BOWEL MOVEMENT: Pain with bowel movement: No Type of bowel movement:Type (Bristol Stool Scale) Type 1 or 4, Frequency daily, and Strain sometimes Fully empty rectum: No Leakage: No                                                      Pads: No Fiber supplement/laxative Miralax   URINATION: Pain with urination: Yes, in the lower abdominal  Fully empty bladder: Noalways feels she has to use the restroom                                Post-void dribble: No Stream: starts off weak then gets strong Urgency: Yes  Frequency:during the day urinate every hour                                                         Nocturia: Yes: 2-3   Leakage: Walking to the bathroom Pads/briefs: No  INTERCOURSE:  Ability to have vaginal penetration Yes , discomfort Pain with intercourse: During Penetration and After Intercourse Dryness: Yes  Climax: no Marinoff Scale: 1/3 Lubricant: uber lube but not now  PREGNANCY: Vaginal deliveries 3, tubal pregnancies Tearing No Episiotomy No C-section deliveries 0 Currently pregnant Does not know  PROLAPSE: Pressure   OBJECTIVE:  Note: Objective measures were completed at Evaluation unless otherwise noted.  DIAGNOSTIC FINDINGS:  none  PATIENT SURVEYS:  PFIQ-7: 47 POPIQ-7: 43  COGNITION: Overall cognitive status: Within functional limits for tasks assessed     SENSATION: Light touch: Appears intact   FUNCTIONAL TESTS:  Single leg stance:  Rt: 10 s  Lt:10 s Squat: knees over toes, no flexion at the hips   GAIT: Assistive device utilized: None   POSTURE: rounded shoulders, forward head, and decreased lumbar lordosis   LUMBARAROM/PROM:  A/PROM A/PROM   Eval (% available)  Flexion 75  Extension WFL  Right lateral flexion WFL  Left lateral flexion WFL  Right rotation 75 pain in right flank  Left rotation 75   (Blank rows =  not tested)  LOWER EXTREMITY ROM: bilateral hip ROM is full   LOWER EXTREMITY MMT: Bilateral hip strength 5/5  PALPATION:  General: tenderness located in right quadratus, right gluteal  Pelvic Alignment: ASIS is equal  Abdominal: tenderness in the right abdomen and suprapubic area; difficulty with contracting the abdomen  Diastasis: No Distortion: No  Breathing: upper chest breathing Scar tissue: No                External Perineal Exam: ischiocavernosus; perinea body, levator ani                              Internal Pelvic Floor: obturator internist, levator ani, right side of the bladder  Patient confirms identification and approves PT to assess internal pelvic floor and treatment Yes No emotional/communication barriers or cognitive limitation. Patient is motivated to learn. Patient understands and agrees with treatment goals and plan. PT explains patient will be examined in standing, sitting, and lying down to see how their muscles and joints work. When they are ready, they will be asked to remove their underwear so PT can examine their perineum. The patient is also given the option of providing their own chaperone as one is not provided in our facility. The patient also has the right and is explained the right to defer or refuse any part of the evaluation or treatment including the internal exam. With the patient's consent, PT will use one gloved finger to gently assess the muscles of the pelvic floor, seeing how well it contracts and relaxes and if there is muscle symmetry. After, the patient will get dressed and PT and patient will discuss exam findings and plan of care. PT and patient discuss plan of care, schedule, attendance policy and HEP activities.   PELVIC MMT:   MMT eval  Vaginal 3/5 ant and  post; 2/5 laterlly  Internal Anal Sphincter   External Anal Sphincter   Puborectalis   Diastasis Recti   (Blank rows = not tested)        TONE: Average tone  PROLAPSE: none  TODAY'S TREATMENT:                                                                                                                              DATE: 02/17/24  EVAL Examination completed, findings reviewed, pt educated on POC, HEP, and female pelvic floor anatomy, reasoning with pelvic floor assessment internally with pt consent, and abdominal massage. Pt motivated to participate in PT and agreeable to attempt recommendations.     PATIENT EDUCATION:  02/17/24 Education details: Access Code: [email or web address] Person educated: Patient Education method: Explanation, Demonstration, Tactile cues, Verbal cues, and Handouts Education comprehension: verbalized understanding, returned demonstration, verbal cues required, tactile cues required, and needs further education  HOME EXERCISE PROGRAM: 02/17/24 Access Code: [email or web address] URL: [email or web address] Date: 02/17/2024 Prepared by: Channing Pereyra  Patient Education - Abdominal Massage for Constipation -  Abdominal Massage for Constipation - Treating Persistent Pain Without Medications: Relaxation - Get To Know Your Pelvic Floor- Female For all possible CPT codes, reference the Planned Interventions line above.     Check all conditions that are expected to impact treatment: {Conditions expected to impact treatment:Unknown   If treatment provided at initial evaluation, no treatment charged due to lack of authorization.       ASSESSMENT:  CLINICAL IMPRESSION: Patient is a 38 y.o. female who was seen today for physical therapy evaluation and treatment for dyspareunia. Patient reports the lower abdominal pain is 3-10/10 with cramp and stabbing. Her right flank and gluteal pain is 3-10 and is a discomfort. She sometimes has to strain to have a bowel movement and  does not feel she fully empties her rectum. Pain in lower abdominal when urinating. She has urgency and feels like she has to always use the bathroom. During the day she will urinate every hour. She urinates 2-3 times per night. Patient will leak urine as she is walking to the commode when she has the urge to urinate. She has discomfort during  and after penile penetration vaginally. She has limited trunk flexion and rotation.  She has tenderness located in right quadratus, right gluteal, along the right quadratus, right gluteal, ischiocavernosus, perineal body, levator ani, obturator internist, and left side of the bladder. Pelvic floor strength is 3/5 anteriorly and posteriorly and 2/5 laterally holding 4 sec. She has difficulty with contracting her abdominals. Patient will benefit from skilled therapy to reduce her pain, improve toileting, and improve core strength.   OBJECTIVE IMPAIRMENTS: decreased activity tolerance, decreased coordination, decreased ROM, decreased strength, increased fascial restrictions, increased muscle spasms, postural dysfunction, and pain.   ACTIVITY LIMITATIONS: sitting, continence, toileting, and locomotion level  PARTICIPATION LIMITATIONS: driving, shopping, community activity, and occupation  PERSONAL FACTORS: Time since onset of injury/illness/exacerbation are also affecting patient's functional outcome.   REHAB POTENTIAL: Excellent  CLINICAL DECISION MAKING: Evolving/moderate complexity  EVALUATION COMPLEXITY: Moderate   GOALS: Goals reviewed with patient? Yes  SHORT TERM GOALS: Target date: 03/16/24  Patient educated on diaphragmatic breathing to relax the pelvic floor.  Baseline:not educated yet Goal status: INITIAL  2.  Patient able to contract her abdominals instead of bulging them.  Baseline: not educated yet Goal status: INITIAL  3.  Patient understands ways to manage pain with meditation, stretches, breathing.  Baseline: not educated yet Goal  status: INITIAL    LONG TERM GOALS: Target date: 08/18/23  Patient independent with advanced HEP for core strength and flexibility.  Baseline: not educated yet Goal status: INITIAL  2.  Patient is able to have the urge to urinate and walk to the bathroom without leaking urine.  Baseline: will leak urine Goal status: INITIAL  3.  Patient is able to feel her rectum fully empty due to the ability to fully relax her pelvic floor.  Baseline:  Goal status: INITIAL  4.  Patient is able to have penile penetration vaginally with pain level </= 1/10.  Baseline: pain level 3-10/10 Goal status: INITIAL  5.  Patient is able to do her daily tasks with pain level </= 2/10 due to the reduction of trigger points in the pelvic floor, back and lower abdominals.  Baseline: ;pain level 3-10/10 Goal status: INITIAL   PLAN:  PT FREQUENCY: 1-2x/week  PT DURATION: 6 months  PLANNED INTERVENTIONS: 97110-Therapeutic exercises, 97530- Therapeutic activity, [id number]- Neuromuscular re-education, 97535- Self Care, 02859- Manual therapy, [id number]- Electrical stimulation (unattended), 79439 (1-2 muscles),  79438 (3+ muscles)- Dry Needling, Patient/Family education, Taping, Joint mobilization, Spinal mobilization, Cryotherapy, Moist heat, and Biofeedback  PLAN FOR NEXT SESSION: manual work to the abdomen, right quadratus and gluteal, diaphragmatic breathing, how to toilet, see about meditation   Channing Pereyra, PT 02/17/24 3:04 PM

## 2024-02-17 ENCOUNTER — Encounter: Payer: Self-pay | Admitting: Physical Therapy

## 2024-02-17 ENCOUNTER — Other Ambulatory Visit: Payer: Self-pay

## 2024-02-17 ENCOUNTER — Encounter: Attending: Obstetrics and Gynecology | Admitting: Physical Therapy

## 2024-02-17 DIAGNOSIS — M5459 Other low back pain: Secondary | ICD-10-CM | POA: Diagnosis present

## 2024-02-17 DIAGNOSIS — R252 Cramp and spasm: Secondary | ICD-10-CM | POA: Diagnosis present

## 2024-02-17 DIAGNOSIS — N941 Unspecified dyspareunia: Secondary | ICD-10-CM | POA: Insufficient documentation

## 2024-02-17 DIAGNOSIS — R1030 Lower abdominal pain, unspecified: Secondary | ICD-10-CM | POA: Diagnosis present

## 2024-03-09 ENCOUNTER — Ambulatory Visit: Attending: Cardiology | Admitting: Cardiology

## 2024-03-09 ENCOUNTER — Encounter: Payer: Self-pay | Admitting: Cardiology

## 2024-03-09 VITALS — BP 108/78 | HR 94 | Ht 62.0 in | Wt 182.0 lb

## 2024-03-09 DIAGNOSIS — Z8249 Family history of ischemic heart disease and other diseases of the circulatory system: Secondary | ICD-10-CM | POA: Insufficient documentation

## 2024-03-09 DIAGNOSIS — R002 Palpitations: Secondary | ICD-10-CM | POA: Diagnosis not present

## 2024-03-09 DIAGNOSIS — E7800 Pure hypercholesterolemia, unspecified: Secondary | ICD-10-CM | POA: Insufficient documentation

## 2024-03-09 DIAGNOSIS — R0609 Other forms of dyspnea: Secondary | ICD-10-CM | POA: Diagnosis present

## 2024-03-09 NOTE — Patient Instructions (Addendum)
 Medication Instructions:  Your physician recommends that you continue on your current medications as directed. Please refer to the Current Medication list given to you today.  *If you need a refill on your cardiac medications before your next appointment, please call your pharmacy*   Lab Work:  3rd Floor   [address]  Your physician recommends that you return for lab work in: when fasting   You need to have labs done when you are fasting.  You can come Monday through Friday 8:00 am to 11:30AM and 1:00 to 4:00. You do not need to make an appointment as the order has already been placed.   Testing/Procedures: We will order CT coronary calcium score. It will cost $99.00 and is not covered by insurance.  Please call to schedule.     [facility] [address] [place], [place] [address] [phone]    Your physician has requested that you have an echocardiogram. Echocardiography is a painless test that uses sound waves to create images of your heart. It provides your doctor with information about the size and shape of your heart and how well your heart's chambers and valves are working. This procedure takes approximately one hour. There are no restrictions for this procedure. Please do NOT wear cologne, perfume, aftershave, or lotions (deodorant is allowed). Please arrive 15 minutes prior to your appointment time.  Please note: We ask at that you not bring children with you during ultrasound (echo/ vascular) testing. Due to room size and safety concerns, children are not allowed in the ultrasound rooms during exams. Our front office staff cannot provide observation of children in our lobby area while testing is being conducted. An adult accompanying a patient to their appointment will only be allowed in the ultrasound room at the discretion of the ultrasound technician under special circumstances. We apologize for any inconvenience.    Follow-Up: At [facility], you and  your health needs are our priority.  As part of our continuing mission to provide you with exceptional heart care, we have created designated Provider Care Teams.  These Care Teams include your primary Cardiologist (physician) and Advanced Practice Providers (APPs -  Physician Assistants and Nurse Practitioners) who all work together to provide you with the care you need, when you need it.  We recommend signing up for the patient portal called "MyChart".  Sign up information is provided on this After Visit Summary.  MyChart is used to connect with patients for Virtual Visits (Telemedicine).  Patients are able to view lab/test results, encounter notes, upcoming appointments, etc.  Non-urgent messages can be sent to your provider as well.   To learn more about what you can do with MyChart, go to [email or web address].    Your next appointment:   2 month(s)  The format for your next appointment:   In Person  Provider:   Gypsy Balsam, MD    Other Instructions NA

## 2024-03-09 NOTE — Progress Notes (Signed)
 Cardiology Consultation:    Date:  03/09/2024   ID:  Jill Burke, DOB 09-Dec-1985, MRN [id number]  PCP:  Rosalea Rosina SAILOR, PA  Cardiologist:  Lamar Fitch, MD   Referring MD: Rosalea Rosina SAILOR, [place]   Chief Complaint  Patient presents with   Palpitations    History of Present Illness:    Jill Burke is a 38 y.o. female who is being seen today for the evaluation of palpitations at the request of Rosalea Rosina SAILOR, [place].  Past medical history significant for hypercholesterolemia, palpitations that she suffered from a in 2023.  In 2023 she had evaluation of her palpitation that evaluation included monitor which showed some APCs.  She also supposed to have a calcium score but it was never done.  She would like to be established in our practice.  She still gets some palpitation but those are much less frequent than before but it is somewhat but not as much as before.  Denies have any chest pain tightness squeezing pressure burning chest but admits that she is doing very sedentary lifestyle.  She is content and she states which are all the time.  She tried to get up and some stress but no structural exercises.  She works typically until 7:00 every single day except Sunday when she is not working.  Sometimes Saturday does not work much.  Used to smoke but quit a few months ago.  Does have family history of coronary artery disease.  Past Medical History:  Diagnosis Date   Abdominal pain 09/25/2014   Seen in [facility], no significant findings. To f/u at office for US  and repeat pregnancy test.     Allergic rhinitis 09/30/2023   Anxiety    Anxiety in acute stress reaction 09/12/2019   Atypical chest pain    Atypical squamous cells of undetermined significance (ASCUS) on Papanicolaou smear of cervix 06/30/2021   Breast pain, right 06/21/2012   Patient referred to the [facility] for bilateral breast ultrasound. Appointment scheduled for Monday, June 27, 2012 at 1245.      Chlamydia 2007   treated   Diabetes mellitus without complication (HCC)    Family history of early CAD 05/30/2021   History of ectopic pregnancy 11/11/2022   Hypercholesteremia    Left breast lump 06/21/2012   Two left breast lumps at 3 o'clock and 12 o'clock. Patient referred to the [facility] for bilateral breast ultrasound. Appointment scheduled for Monday, June 27, 2012 at 1245.     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 06/30/2021   Uterine fibroid     Past Surgical History:  Procedure Laterality Date   ECTOPIC PREGNANCY SHOTS      Current Medications: Current Meds  Medication Sig   norethindrone  (MICRONOR ) 0.35 MG tablet Take 1 tablet (0.35 mg total) by mouth daily.     Allergies:   Latex and Soap   Social History   Socioeconomic History   Marital status: Single    Spouse name: Not on file   Number of children: 3   Years of education: 12   Highest education level: GED or equivalent  Occupational History   Occupation: Theatre Manager: [employer]  Tobacco Use   Smoking status: Some Days    Current packs/day: 0.50    Types: Cigarettes   Smokeless tobacco: Never  Vaping Use   Vaping status: Never Used  Substance and Sexual Activity   Alcohol use: No    Alcohol/week: 0.0 standard drinks of alcohol   Drug use: Not Currently    Types: Marijuana   Sexual activity: Yes  Other Topics Concern   Not on file  Social History Narrative   Not on file   Social Drivers of Health   Financial Resource Strain: Low Risk  (09/30/2023)   Overall Financial Resource Strain (CARDIA)    Difficulty of Paying Living Expenses: Not hard at all  Food Insecurity: Low Risk  (01/05/2024)   Received from [facility]   Hunger Vital Sign    Within the past 12 months, you worried that your food would run out before you got money  to buy more: Never true    Within the past 12 months, the food you bought just didn't last and you didn't have money to get more. : Never true  Transportation Needs: No Transportation Needs (01/05/2024)   Received from [facility]    In the past 12 months, has lack of reliable transportation kept you from medical appointments, meetings, work or from getting things needed for daily living? : No  Physical Activity: Insufficiently Active (09/30/2023)   Exercise Vital Sign    Days of Exercise per Week: 2 days    Minutes of Exercise per Session: 10 min  Stress: Stress Concern Present (09/30/2023)   [employer] of Occupational Health - Occupational Stress Questionnaire    Feeling of Stress : To some extent  Social Connections: Moderately Integrated (09/30/2023)   Social Connection and Isolation Panel    Frequency of Communication with Friends and Family: Twice a week    Frequency of Social Gatherings with Friends and Family: Twice a week    Attends Religious Services: More than 4 times per year    Active Member of [employer] or Organizations: No    Attends Engineer, Structural: Not on file    Marital Status: Married     Family History: The patient's family history includes COPD in her mother; Cancer in her maternal grandmother; Diabetes in her paternal grandmother; Heart attack in her father; Heart disease in her father; Hyperlipidemia in her father; Hypertension in her father; Liver disease in her maternal grandfather; Stroke in her father. ROS:   Please see the history of present illness.    All 14 point review of systems negative except as described per history of present illness.  EKGs/Labs/Other Studies Reviewed:    The following studies were reviewed today:   EKG:       Recent Labs: 09/29/2023: ALT 9; BUN 14; Creatinine, Ser 0.86; Hemoglobin 14.9; Platelets 339; Potassium 4.0; Sodium 140 09/30/2023: TSH 1.290  Recent Lipid Panel No results found for:  CHOL, TRIG, HDL, CHOLHDL, VLDL, LDLCALC, LDLDIRECT  Physical Exam:    VS:  BP 108/78   Pulse 94   Ht 5' 2 (1.575 m)   Wt 182 lb (82.6 kg)   SpO2 97%   BMI 33.29 kg/m     Wt Readings from Last 3 Encounters:  03/09/24 182 lb (82.6 kg)  09/29/23 167 lb (75.8 kg)  09/28/23 168 lb (76.2 kg)     GEN:  Well nourished, well developed in no acute distress HEENT: Normal NECK: No JVD; No carotid bruits LYMPHATICS: No lymphadenopathy CARDIAC: RRR, no murmurs, no rubs, no gallops RESPIRATORY:  Clear to auscultation without rales, wheezing or rhonchi  ABDOMEN: Soft, non-tender,  non-distended MUSCULOSKELETAL:  No edema; No deformity  SKIN: Warm and dry NEUROLOGIC:  Alert and oriented x 3 PSYCHIATRIC:  Normal affect   ASSESSMENT:    1. Palpitations   2. Hypercholesteremia   3. Family history of early CAD    PLAN:    In order of problems listed above:  Palpitations.  I did review monitor from 2023.  She said palpitations are not too bothersome.  I will not put another monitor will wait and see how things will progress with understanding if she start having more palpitation symptoms beta-blocker will be beneficial.  As a part of evaluation echocardiogram be done to assess left ventricular ejection fraction. Dyslipidemia I will schedule her to have fasting lipid profile done when she will come for our testing Family history of early coronary artery disease.  Risk factors modification should she stop smoking we talked about exercise on the regular basis will check her cholesterol.   Medication Adjustments/Labs and Tests Ordered: Current medicines are reviewed at length with the patient today.  Concerns regarding medicines are outlined above.  Orders Placed This Encounter  Procedures   EKG 12-Lead   No orders of the defined types were placed in this encounter.   Signed, Lamar DOROTHA Fitch, MD, [facility]. 03/09/2024 4:25 PM    [facility]

## 2024-03-16 ENCOUNTER — Encounter: Payer: Self-pay | Admitting: Physical Therapy

## 2024-03-16 ENCOUNTER — Encounter: Payer: Self-pay | Attending: Obstetrics and Gynecology | Admitting: Physical Therapy

## 2024-03-16 DIAGNOSIS — M5459 Other low back pain: Secondary | ICD-10-CM | POA: Insufficient documentation

## 2024-03-16 DIAGNOSIS — R252 Cramp and spasm: Secondary | ICD-10-CM | POA: Insufficient documentation

## 2024-03-16 DIAGNOSIS — R1030 Lower abdominal pain, unspecified: Secondary | ICD-10-CM | POA: Diagnosis present
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
## 2024-03-21 ENCOUNTER — Encounter: Payer: Self-pay | Admitting: Physical Therapy

## 2024-03-21 DIAGNOSIS — R252 Cramp and spasm: Secondary | ICD-10-CM

## 2024-03-21 DIAGNOSIS — M5459 Other low back pain: Secondary | ICD-10-CM

## 2024-03-21 DIAGNOSIS — R1030 Lower abdominal pain, unspecified: Secondary | ICD-10-CM

## 2024-03-21 NOTE — Therapy (Signed)
 OUTPATIENT PHYSICAL THERAPY FEMALE PELVIC EVALUATION   Patient Name: Jill Burke MRN: [id number] DOB:12-25-1985, 38 y.o., female Today's Date: 03/21/2024  END OF SESSION:  PT End of Session - 03/21/24 1606     Visit Number 3    Date for Recertification  08/17/24    Authorization Type [employer]    Authorization Time Period 02/17/2024-04/17/2024    Authorization - Visit Number 2    Authorization - Number of Visits 10    PT Start Time 1600    PT Stop Time 1650    PT Time Calculation (min) 50 min    Activity Tolerance Patient tolerated treatment well    Behavior During Therapy [facility] for tasks assessed/performed          Past Medical History:  Diagnosis Date   Abdominal pain 09/25/2014   Seen in MAU, no significant findings. To f/u at office for US  and repeat pregnancy test.     Allergic rhinitis 09/30/2023   Anxiety    Anxiety in acute stress reaction 09/12/2019   Atypical chest pain    Atypical squamous cells of undetermined significance (ASCUS) on Papanicolaou smear of cervix 06/30/2021   Breast pain, right 06/21/2012   Patient referred to the [facility] for bilateral breast ultrasound. Appointment scheduled for Monday, June 27, 2012 at 1245.     Chlamydia 2007   treated   Diabetes mellitus without complication (HCC)    Family history of early CAD 05/30/2021   History of ectopic pregnancy 11/11/2022   Hypercholesteremia    Left breast lump 06/21/2012   Two left breast lumps at 3 o'clock and 12 o'clock. Patient referred to the [facility] for bilateral breast ultrasound. Appointment scheduled for Monday, June 27, 2012 at 1245.     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 06/30/2021   Uterine fibroid    Past Surgical History:  Procedure Laterality Date   ECTOPIC PREGNANCY SHOTS     Patient Active  Problem List   Diagnosis Date Noted   Anxiety    Atypical chest pain    Diabetes mellitus without complication (HCC)    Hypercholesteremia    Lump, breast    Uterine fibroid    Allergic rhinitis 09/30/2023   Recurrent sinusitis 05/21/2023   History of ectopic pregnancy 11/11/2022   Atypical squamous cells of undetermined significance (ASCUS) on Papanicolaou smear of cervix 06/30/2021   Refractory migraine without aura 06/30/2021   Family history of early CAD 05/30/2021   Mixed hyperlipidemia 05/30/2021   Palpitations 05/15/2021   Panic attacks 09/12/2019   Anxiety in acute stress reaction 09/12/2019   Abdominal pain 09/25/2014   Left breast lump 06/21/2012   Breast pain, right 06/21/2012   Chlamydia 2007    PCP: Rosalea Rosina SAILOR, [place]  REFERRING PROVIDER: Delores Nidia CROME, FNP   REFERRING DIAG: N94.10 (ICD-10-CM) - Dyspareunia in female   THERAPY DIAG:  Cramp and spasm  Lower abdominal pain  Other low back pain  Rationale for Evaluation and Treatment: Rehabilitation  ONSET DATE: 4/25  SUBJECTIVE:  SUBJECTIVE STATEMENT: I feel better since last visit.     FUNCTIONAL LIMITATIONS: none  PERTINENT HISTORY:  Medications for current condition: none Surgeries: none Other: Diabetes; uterine fibroid Sexual abuse: No  PAIN:  Are you having pain? Yes NPRS scale: 3-10/10 03/16/24: pain level 3-10/10 03/21/24: 0/10 pain level Pain location: lower right abdomen, right flank, and right gluteal  Pain type: cramp and stabbing in right lower quadrant; pain in right flank and gluteal is discomfort Pain description: intermittent , can last for hours and days  Aggravating factors: penile penetration, comes on randomly Relieving factors: tylenol , heating pad, leaves randomly  PRECAUTIONS:  None  RED FLAGS: None   WEIGHT BEARING RESTRICTIONS: Yes    FALLS:  Has patient fallen in last 6 months? No  OCCUPATION: accounting sitting at computer  ACTIVITY LEVEL : not exercise regularly   PLOF: Independent  PATIENT GOALS: reduce the amount she is using the restroom, reduce pain   BOWEL MOVEMENT: Pain with bowel movement: No Type of bowel movement:Type (Bristol Stool Scale) Type 1 or 4, Frequency daily, and Strain sometimes Fully empty rectum: No Leakage: No                                                      Pads: No Fiber supplement/laxative Miralax   URINATION: Pain with urination: Yes, in the lower abdominal  Fully empty bladder: Noalways feels she has to use the restroom                                Post-void dribble: No Stream: starts off weak then gets strong Urgency: Yes  Frequency:during the day urinate every hour                                                         Nocturia: Yes: 2-3   Leakage: Walking to the bathroom Pads/briefs: No  INTERCOURSE:  Ability to have vaginal penetration Yes , discomfort Pain with intercourse: During Penetration and After Intercourse Dryness: Yes  Climax: no Marinoff Scale: 1/3 Lubricant: uber lube but not now  PREGNANCY: Vaginal deliveries 3, tubal pregnancies Tearing No Episiotomy No C-section deliveries 0 Currently pregnant Does not know  PROLAPSE: Pressure   OBJECTIVE:  Note: Objective measures were completed at Evaluation unless otherwise noted.  DIAGNOSTIC FINDINGS:  none  PATIENT SURVEYS:  PFIQ-7: 40 POPIQ-7: 43  COGNITION: Overall cognitive status: Within functional limits for tasks assessed     SENSATION: Light touch: Appears intact   FUNCTIONAL TESTS:  Single leg stance:  Rt: 10 s  Lt:10 s Squat: knees over toes, no flexion at the hips   GAIT: Assistive device utilized: None   POSTURE: rounded shoulders, forward head, and decreased lumbar  lordosis   LUMBARAROM/PROM:  A/PROM A/PROM  Eval (% available)  Flexion 75  Extension WFL  Right lateral flexion WFL  Left lateral flexion WFL  Right rotation 75 pain in right flank  Left rotation 75   (Blank rows = not tested)  LOWER EXTREMITY ROM: bilateral hip ROM is full   LOWER EXTREMITY MMT: Bilateral hip strength 5/5  PALPATION:  General: tenderness located in right quadratus, right gluteal  Pelvic Alignment: ASIS is equal  Abdominal: tenderness in the right abdomen and suprapubic area; difficulty with contracting the abdomen  Diastasis: No Distortion: No  Breathing: upper chest breathing Scar tissue: No                External Perineal Exam: ischiocavernosus; perinea body, levator ani                              Internal Pelvic Floor: obturator internist, levator ani, right side of the bladder  Patient confirms identification and approves PT to assess internal pelvic floor and treatment Yes No emotional/communication barriers or cognitive limitation. Patient is motivated to learn. Patient understands and agrees with treatment goals and plan. PT explains patient will be examined in standing, sitting, and lying down to see how their muscles and joints work. When they are ready, they will be asked to remove their underwear so PT can examine their perineum. The patient is also given the option of providing their own chaperone as one is not provided in our facility. The patient also has the right and is explained the right to defer or refuse any part of the evaluation or treatment including the internal exam. With the patient's consent, PT will use one gloved finger to gently assess the muscles of the pelvic floor, seeing how well it contracts and relaxes and if there is muscle symmetry. After, the patient will get dressed and PT and patient will discuss exam findings and plan of care. PT and patient discuss plan of care, schedule, attendance policy and HEP activities.    PELVIC MMT:   MMT eval  Vaginal 3/5 ant and post; 2/5 laterlly  Internal Anal Sphincter   External Anal Sphincter   Puborectalis   Diastasis Recti   (Blank rows = not tested)        TONE: Average tone  PROLAPSE: none  TODAY'S TREATMENT:    03/21/24 Manual: Myofascial release: Fascial release around the round ligament bilaterally Fascial release around the clitoris going in all directions Fascial release around the right obturator internist with one hand on the obturator foreman and other along the superior pubic bone Release of the levator ani externally on the right Release around the mons pubis mobilization: Hip mobilization grade 3 for lateral glide, distraction , inferior glide and long axis distraction  Neuromuscular re-education: Form correction: Stretch bilateral hip flexion, piriformis, and lateral hip in prone to elongate the tissue Down training: Diaphragmatic breathing while sitting on a physioball to relax the pelvic floor.   03/16/24 Manual: Soft tissue mobilization: Circular massage to the abdomen and ILU massage to promote peristalic motion of the intestines Manual work to the diaphragm to work with diaphragmatic breathing Myofascial release: Fascial release around the area of the kidneys, ureters and bladder to reduce restrictions and reduce pain Tissue rolling of the abdomen and around the lower rib cage Exercises: Stretches/mobility: Happy baby stretch with diaphragmatic breathing Self-care: Educated patient on meditation for 2-3 time per week Reviewed with patient on abdominal massage  DATE: 02/17/24  EVAL Examination completed, findings reviewed, pt educated on POC, HEP, and female pelvic floor anatomy, reasoning with pelvic floor assessment internally with pt consent, and abdominal massage. Pt motivated to participate in  PT and agreeable to attempt recommendations.     PATIENT EDUCATION:  03/16/24 Education details: Access Code: [email or web address] Person educated: Patient Education method: Explanation, Demonstration, Tactile cues, Verbal cues, and Handouts Education comprehension: verbalized understanding, returned demonstration, verbal cues required, tactile cues required, and needs further education  HOME EXERCISE PROGRAM: 03/16/24 Access Code: [email or web address] URL: [email or web address] Date: 03/16/2024 Prepared by: Channing Pereyra  Exercises - Happy Baby with Pelvic Floor Lengthening  - 1 x daily - 7 x weekly - 1 sets - 1 reps - 1-2 min hold  Patient Education - Abdominal Massage for Constipation - Abdominal Massage for Constipation - Treating Persistent Pain Without Medications: Relaxation - Get To Know Your Pelvic Floor- Female   ASSESSMENT:  CLINICAL IMPRESSION: Patient is a 38 y.o. female who was seen today for physical therapy  treatment for dyspareunia. Patient has not had the right lower abdominal pain for 2 weeks.  She had tightness along the posterior hips. She had restriction in the levator ani, obturator internist, round ligament and clitoris that were released with manual work. She is starting to feel her pelvic floor relax with diaphragmatic breathing and has started to do meditation. Patient will benefit from skilled therapy to reduce her pain, improve toileting, and improve core strength.   OBJECTIVE IMPAIRMENTS: decreased activity tolerance, decreased coordination, decreased ROM, decreased strength, increased fascial restrictions, increased muscle spasms, postural dysfunction, and pain.   ACTIVITY LIMITATIONS: sitting, continence, toileting, and locomotion level  PARTICIPATION LIMITATIONS: driving, shopping, community activity, and occupation  PERSONAL FACTORS: Time since onset of injury/illness/exacerbation are also affecting patient's functional outcome.   REHAB POTENTIAL:  Excellent  CLINICAL DECISION MAKING: Evolving/moderate complexity  EVALUATION COMPLEXITY: Moderate   GOALS: Goals reviewed with patient? Yes  SHORT TERM GOALS: Target date: 03/16/24  Patient educated on diaphragmatic breathing to relax the pelvic floor.  Baseline:not educated yet Goal status: Met 03/21/24  2.  Patient able to contract her abdominals instead of bulging them.  Baseline: not educated yet Goal status: INITIAL  3.  Patient understands ways to manage pain with meditation, stretches, breathing.  Baseline: not educated yet Goal status: Met 03/21/24    LONG TERM GOALS: Target date: 08/18/23  Patient independent with advanced HEP for core strength and flexibility.  Baseline: not educated yet Goal status: INITIAL  2.  Patient is able to have the urge to urinate and walk to the bathroom without leaking urine.  Baseline: will leak urine Goal status: INITIAL  3.  Patient is able to feel her rectum fully empty due to the ability to fully relax her pelvic floor.  Baseline:  Goal status: INITIAL  4.  Patient is able to have penile penetration vaginally with pain level </= 1/10.  Baseline: pain level 3-10/10 Goal status: INITIAL  5.  Patient is able to do her daily tasks with pain level </= 2/10 due to the reduction of trigger points in the pelvic floor, back and lower abdominals.  Baseline: ;pain level 3-10/10 Goal status: INITIAL   PLAN:  PT FREQUENCY: 1-2x/week  PT DURATION: 6 months  PLANNED INTERVENTIONS: 97110-Therapeutic exercises, 97530- Therapeutic activity, [id number]- Neuromuscular re-education, 97535- Self Care, 02859- Manual therapy, [id number]- Electrical stimulation (unattended), 20560 (1-2 muscles), 20561 (3+ muscles)- Dry Needling, Patient/Family education, Taping, Joint mobilization, Spinal mobilization, Cryotherapy, Moist heat, and  Biofeedback  PLAN FOR NEXT SESSION: Right quadratus and gluteal,  how to toilet,  abdominal contraction; posterior hip  stretches   Channing Pereyra, PT 03/21/24 4:59 PM

## 2024-03-28 ENCOUNTER — Ambulatory Visit: Admitting: Neurology

## 2024-03-28 ENCOUNTER — Encounter: Payer: Self-pay | Admitting: Neurology

## 2024-03-28 ENCOUNTER — Ambulatory Visit (HOSPITAL_BASED_OUTPATIENT_CLINIC_OR_DEPARTMENT_OTHER)
Admission: RE | Admit: 2024-03-28 | Discharge: 2024-03-28 | Disposition: A | Discharge status: Home / Self Care | Source: Ambulatory Visit | Attending: Cardiology | Admitting: Cardiology

## 2024-03-28 VITALS — BP 125/84 | HR 83 | Ht 62.0 in | Wt 180.0 lb

## 2024-03-28 DIAGNOSIS — R0609 Other forms of dyspnea: Secondary | ICD-10-CM | POA: Diagnosis present

## 2024-03-28 DIAGNOSIS — G43709 Chronic migraine without aura, not intractable, without status migrainosus: Secondary | ICD-10-CM | POA: Diagnosis not present

## 2024-03-28 DIAGNOSIS — Z8249 Family history of ischemic heart disease and other diseases of the circulatory system: Secondary | ICD-10-CM | POA: Insufficient documentation

## 2024-03-28 LAB — ECHOCARDIOGRAM COMPLETE
AR max vel: 2.52 cm2
AV Area VTI: 2.54 cm2
AV Area mean vel: 2.46 cm2
AV Mean grad: 3 mmHg
AV Peak grad: 5.5 mmHg
Ao pk vel: 1.17 m/s
Area-P 1/2: 4.1 cm2
Calc EF: 68.8 %
MV M vel: 2.29 m/s
MV Peak grad: 21 mmHg
S' Lateral: 2.4 cm
Single Plane A2C EF: 68.3 %
Single Plane A4C EF: 65 %

## 2024-03-28 MED ORDER — INDOMETHACIN 25 MG PO CAPS: 25.0000 mg | ORAL_CAPSULE | Freq: Two times a day (BID) | ORAL | 1 refills | Status: AC

## 2024-03-28 MED ORDER — NORTRIPTYLINE HCL 10 MG PO CAPS: 20.0000 mg | ORAL_CAPSULE | Freq: Every day | ORAL | 11 refills | Status: AC
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
## 2024-03-30 ENCOUNTER — Encounter: Payer: Self-pay | Admitting: Physical Therapy

## 2024-03-30 ENCOUNTER — Ambulatory Visit: Payer: Self-pay | Admitting: Cardiology

## 2024-04-17 ENCOUNTER — Telehealth: Payer: Self-pay | Admitting: Physical Therapy

## 2024-04-17 NOTE — Telephone Encounter (Signed)
 Called patient to see if she wanted to continue physical therapy and if she wanted to take the 10:30 opening tomorrow.  Channing Pereyra, PT @12 /8/25@ 12:57 PM

## 2024-05-16 ENCOUNTER — Ambulatory Visit: Payer: Self-pay | Admitting: Cardiology

## 2024-06-07 ENCOUNTER — Ambulatory Visit: Payer: Self-pay | Admitting: Cardiology

## 2024-07-24 ENCOUNTER — Ambulatory Visit: Admitting: Adult Health

## 2024-07-25 ENCOUNTER — Ambulatory Visit: Admitting: Cardiology
